# Patient Record
Sex: Male | Born: 1975
Health system: Southern US, Community
[De-identification: ages and names within clinical notes are randomized; demographics above are authoritative.]

---

## 1999-04-09 ENCOUNTER — Emergency Department (HOSPITAL_COMMUNITY): Admission: EM | Admit: 1999-04-09 | Discharge: 1999-04-09 | Payer: Self-pay

## 1999-04-29 ENCOUNTER — Emergency Department (HOSPITAL_COMMUNITY): Admission: EM | Admit: 1999-04-29 | Discharge: 1999-04-29 | Payer: Self-pay | Admitting: *Deleted

## 1999-07-11 ENCOUNTER — Emergency Department (HOSPITAL_COMMUNITY): Admission: EM | Admit: 1999-07-11 | Discharge: 1999-07-11 | Payer: Self-pay | Admitting: Emergency Medicine

## 1999-07-12 ENCOUNTER — Emergency Department (HOSPITAL_COMMUNITY): Admission: EM | Admit: 1999-07-12 | Discharge: 1999-07-12 | Payer: Self-pay | Admitting: Emergency Medicine

## 2002-02-08 ENCOUNTER — Encounter: Payer: Self-pay | Admitting: Family Medicine

## 2002-02-08 ENCOUNTER — Encounter: Admission: RE | Admit: 2002-02-08 | Discharge: 2002-02-08 | Payer: Self-pay | Admitting: Family Medicine

## 2002-10-22 ENCOUNTER — Encounter: Payer: Self-pay | Admitting: Family Medicine

## 2002-10-22 ENCOUNTER — Encounter: Admission: RE | Admit: 2002-10-22 | Discharge: 2002-10-22 | Payer: Self-pay | Admitting: Family Medicine

## 2003-02-02 HISTORY — PX: ACHILLES TENDON SURGERY: SHX542

## 2003-05-24 ENCOUNTER — Emergency Department (HOSPITAL_COMMUNITY): Admission: EM | Admit: 2003-05-24 | Discharge: 2003-05-24 | Payer: Self-pay | Admitting: Emergency Medicine

## 2010-04-28 ENCOUNTER — Telehealth: Payer: Self-pay | Admitting: *Deleted

## 2010-04-28 NOTE — Telephone Encounter (Signed)
Has new pt appt scheduled on 06/02/10, would like to know if he could be worked in sooner.  Not having any major concerns but would like to get bp checked.

## 2010-04-28 NOTE — Telephone Encounter (Signed)
Have him purchase.  A pump up digital blood pressure cuff and measure his blood pressure daily at home in the morning............ Normal 135/85. ,,,,,,,,,,,,,,,,,,,,, if after two weeks of data gathering his blood pressure is normal and just checking weekly.  If after two, weeks it's elevated it has come to see [place] sooner

## 2010-04-29 NOTE — Telephone Encounter (Signed)
Left message on machine for patient

## 2010-06-02 ENCOUNTER — Ambulatory Visit (INDEPENDENT_AMBULATORY_CARE_PROVIDER_SITE_OTHER): Payer: 59 | Admitting: Family Medicine

## 2010-06-02 ENCOUNTER — Encounter: Payer: Self-pay | Admitting: Family Medicine

## 2010-06-02 VITALS — BP 134/84 | Temp 98.8°F | Ht 67.0 in | Wt 205.0 lb

## 2010-06-02 DIAGNOSIS — R030 Elevated blood-pressure reading, without diagnosis of hypertension: Secondary | ICD-10-CM

## 2010-06-02 NOTE — Progress Notes (Signed)
  Subjective:    Patient ID: Garrett Anderson, male    DOB: 03/07/1975, 35 y.o.   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  He status, though the past couple months.  His blood pressure would jump up.  He typically runs 135/85 or less.  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  His mother had hypertension.  His always been in excellent health.  He had a left Achilles' tendon repair, and L5.  A colonoscopy in 09, which is normal.  Because of a family history of colon cancer.  Married 3 kids.  Vaccinations up to date.      Review of Systems    General and cardiovascular review of systems otherwise negative Objective:   Physical Exam Well-developed well-nourished, male in no acute distress.  BP right arm sitting position 150/70       Assessment & Plan:  Question hypertension,,,,,,,,,,,, BP check.  Q.a.m. X 3 weeks.  Return in 3 weeks with the data

## 2010-06-02 NOTE — Patient Instructions (Signed)
Check your blood pressure daily in the morning.  Return in 3 weeks with the data and the device

## 2010-06-30 ENCOUNTER — Encounter: Payer: Self-pay | Admitting: Family Medicine

## 2010-06-30 ENCOUNTER — Ambulatory Visit (INDEPENDENT_AMBULATORY_CARE_PROVIDER_SITE_OTHER): Payer: 59 | Admitting: Family Medicine

## 2010-06-30 VITALS — BP 148/90 | Temp 98.6°F | Wt 204.0 lb

## 2010-06-30 DIAGNOSIS — I10 Essential (primary) hypertension: Secondary | ICD-10-CM

## 2010-06-30 MED ORDER — LISINOPRIL-HYDROCHLOROTHIAZIDE 10-12.5 MG PO TABS: ORAL_TABLET | ORAL | Status: DC

## 2010-06-30 NOTE — Progress Notes (Signed)
  Subjective:    Patient ID: Garrett Anderson, male    DOB: 26-Feb-1975, 35 y.o.   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  He said elevated blood pressures at home and at work.  We saw him a month ago.  We put him on a salt free diet and is to monitoring his blood pressure at home.  He is pretty much normal in the morning 130/82, but during the day.  He climbs up into the 140 to 150 systolic, 85 to 90 diastolic.  Positive family history of hypertension    Review of Systems    General and cardiovascular review of systems otherwise negative Objective:   Physical Exam    Well-developed well-nourished, male in no acute distress.  BP right arm sitting, 140/90    Assessment & Plan:  ,Hypertension,,,,,,,,,,,, start Zestoretic one half tab daily BP check 3 times daily.  Follow-up in one month

## 2010-06-30 NOTE — Patient Instructions (Signed)
Zestoretic one half tab q.a.m.  Hives and cough.  The two potential side effects,,,,,,,,,,,, if he developed hives stopped the medicine immediately and call us.  Check your blood pressure morning midafternoon and bedtime.  Follow-up in 4 weeks

## 2010-07-28 ENCOUNTER — Ambulatory Visit: Payer: 59 | Admitting: Family Medicine

## 2010-08-03 ENCOUNTER — Ambulatory Visit (INDEPENDENT_AMBULATORY_CARE_PROVIDER_SITE_OTHER): Payer: 59 | Admitting: Family Medicine

## 2010-08-03 ENCOUNTER — Encounter: Payer: Self-pay | Admitting: Family Medicine

## 2010-08-03 VITALS — BP 110/80 | Temp 98.4°F | Wt 204.0 lb

## 2010-08-03 DIAGNOSIS — I10 Essential (primary) hypertension: Secondary | ICD-10-CM

## 2010-08-03 NOTE — Progress Notes (Signed)
  Subjective:    Patient ID: Garrett Anderson, male    DOB: 03-20-1975, 35 y.o.   MRN: [id number]  HPIhassan Is a 35 year old male, married, who comes in today,,,,, nonsmoker,,,,,, for follow-up of hypertension.  We started him on Zestoretic 10 to 12.5 dose one half tab q.a.m., a month ago.  Blood pressure now is 110/80.  No side effects to medication    Review of Systems    General and cardiovascular use systems negative Objective:   Physical Exam    Well-developed well-nourished, male, no acute distress.  BP 110/80    Assessment & Plan:  Hypertension under good control with Zestoretic 10 to 12.5 dose one half tab q.a.m. Continue medication.  Daily follow-up CPX

## 2010-08-03 NOTE — Patient Instructions (Signed)
Continued your current medications one half tab daily.  Check your blood pressure weekly in the morning.  If you get an elevated reading and check it daily for 3 weeks and if the blood pressure is consistently elevated.  After 3 weeks, then return to see me for follow-up.  Set up a time in the next month or two for a physical exam,,,,,,,,,,,,, fasting labs one week prior

## 2010-10-12 ENCOUNTER — Ambulatory Visit (INDEPENDENT_AMBULATORY_CARE_PROVIDER_SITE_OTHER): Payer: 59 | Admitting: Family Medicine

## 2010-10-12 ENCOUNTER — Encounter: Payer: Self-pay | Admitting: Family Medicine

## 2010-10-12 VITALS — BP 158/98 | Temp 98.5°F | Ht 67.25 in | Wt 204.0 lb

## 2010-10-12 DIAGNOSIS — I10 Essential (primary) hypertension: Secondary | ICD-10-CM

## 2010-10-12 LAB — POCT URINALYSIS DIPSTICK
Bilirubin, UA: NEGATIVE
Leukocytes, UA: NEGATIVE
Nitrite, UA: NEGATIVE
Urobilinogen, UA: 0.2
pH, UA: 7

## 2010-10-12 LAB — LIPID PANEL
HDL: 39.9 mg/dL (ref >39.00)
LDL Cholesterol: 130 mg/dL — ABNORMAL HIGH (ref 0–99)
Total CHOL/HDL Ratio: 4

## 2010-10-12 LAB — HEPATIC FUNCTION PANEL
ALT: 24 U/L (ref 0–53)
Alkaline Phosphatase: 54 U/L (ref 39–117)
Bilirubin, Direct: 0 mg/dL (ref 0.0–0.3)
Total Bilirubin: 0.5 mg/dL (ref 0.3–1.2)
Total Protein: 7.1 g/dL (ref 6.0–8.3)

## 2010-10-12 LAB — CBC WITH DIFFERENTIAL/PLATELET
Basophils Relative: 0.5 % (ref 0.0–3.0)
Eosinophils Absolute: 0.1 10*3/uL (ref 0.0–0.7)
Eosinophils Relative: 2.9 % (ref 0.0–5.0)
Lymphocytes Relative: 40.5 % (ref 12.0–46.0)
Neutrophils Relative %: 43.4 % (ref 43.0–77.0)
RBC: 4.86 Mil/uL (ref 4.22–5.81)
WBC: 3.4 10*3/uL — ABNORMAL LOW (ref 4.5–10.5)

## 2010-10-12 LAB — BASIC METABOLIC PANEL
Calcium: 8.9 mg/dL (ref 8.4–10.5)
Creatinine, Ser: 1.1 mg/dL (ref 0.4–1.5)

## 2010-10-12 MED ORDER — LISINOPRIL-HYDROCHLOROTHIAZIDE 10-12.5 MG PO TABS: ORAL_TABLET | ORAL | Status: DC

## 2010-10-12 NOTE — Progress Notes (Signed)
  Subjective:    Patient ID: Garrett Anderson, male    DOB: 1975/03/21, 35 y.o.   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  He was on Zestoretic 10 to 12.5, and his blood pressure was normal.  However, he stopped it about 3 weeks ago for two weeks, then restarted week ago.  Advise in the future not to stop his daily blood pressure medication.  BP today 150/98.  He will take his medication daily and faxed the data in 3 weeks.  If blood pressures not to goal.  Review of systems negative.  Vaccination history tetanus 2008 he gets the flu shot at work    Review of Systems    Total review of systems otherwise negative Objective:   Physical Exam  Well-developed muscular male, in no acute distress.  Examination of the head, eyes, ears, nose, and throat were negative.  Neck was supple.  No adenopathy.  Thyroid normal.  Lungs are clear to auscultation.  Cardiac exam normal.  Abdominal exam normal.  Genitalia normal circumcised male.  Extremities normal.  Skin normal.  Peripheral pulses normal     Assessment & Plan:  Healthy male.  Hypertension take the Zestoretic 10 -- 12.5 dose one half tab q.a.m. If in 3 weeks.  Blood pressure is not at goal.  Call

## 2010-10-12 NOTE — Patient Instructions (Signed)
Continued the Zestoretic............. One half tab / day of your blood pressure medication.  Check your blood pressure daily.............. if in 3 weeks, your blood pressure is 135/85 or less than continue a half a tablet daily.  If however, your blood pressure is not at goal.  Call, so we can readjust y  medication

## 2011-05-07 ENCOUNTER — Ambulatory Visit: Payer: 59 | Admitting: Internal Medicine

## 2011-11-03 ENCOUNTER — Other Ambulatory Visit: Payer: Self-pay | Admitting: Family Medicine

## 2012-03-01 ENCOUNTER — Ambulatory Visit: Payer: 59 | Admitting: Family Medicine

## 2012-03-02 ENCOUNTER — Ambulatory Visit: Payer: 59 | Admitting: Family Medicine

## 2012-04-10 ENCOUNTER — Other Ambulatory Visit: Payer: Self-pay | Admitting: *Deleted

## 2012-04-10 MED ORDER — LISINOPRIL-HYDROCHLOROTHIAZIDE 10-12.5 MG PO TABS: ORAL_TABLET | ORAL | Status: DC

## 2012-05-12 ENCOUNTER — Encounter: Payer: Self-pay | Admitting: Family Medicine

## 2012-05-12 ENCOUNTER — Ambulatory Visit (INDEPENDENT_AMBULATORY_CARE_PROVIDER_SITE_OTHER): Payer: 59 | Admitting: Family Medicine

## 2012-05-12 VITALS — BP 140/90 | Temp 98.6°F | Wt 196.0 lb

## 2012-05-12 DIAGNOSIS — I10 Essential (primary) hypertension: Secondary | ICD-10-CM

## 2012-05-12 DIAGNOSIS — R079 Chest pain, unspecified: Secondary | ICD-10-CM | POA: Insufficient documentation

## 2012-05-12 NOTE — Patient Instructions (Signed)
Motrin 400 mg,,,,,,,,,,,,,,, twice daily with food as needed for the chest wall pain  Continue blood pressure medication daily  Monitor your blood pressure weekly as outlined  Return when necessary
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
## 2012-06-16 ENCOUNTER — Other Ambulatory Visit: Payer: Self-pay | Admitting: Family Medicine

## 2012-07-19 ENCOUNTER — Ambulatory Visit (INDEPENDENT_AMBULATORY_CARE_PROVIDER_SITE_OTHER): Payer: 59 | Admitting: Family

## 2012-07-19 ENCOUNTER — Other Ambulatory Visit: Payer: Self-pay | Admitting: *Deleted

## 2012-07-19 ENCOUNTER — Encounter: Payer: Self-pay | Admitting: Family

## 2012-07-19 VITALS — BP 130/76 | HR 75 | Temp 98.6°F | Wt 200.0 lb

## 2012-07-19 DIAGNOSIS — H9202 Otalgia, left ear: Secondary | ICD-10-CM

## 2012-07-19 DIAGNOSIS — H6692 Otitis media, unspecified, left ear: Secondary | ICD-10-CM

## 2012-07-19 DIAGNOSIS — H6690 Otitis media, unspecified, unspecified ear: Secondary | ICD-10-CM

## 2012-07-19 DIAGNOSIS — H9209 Otalgia, unspecified ear: Secondary | ICD-10-CM

## 2012-07-19 MED ORDER — LISINOPRIL-HYDROCHLOROTHIAZIDE 10-12.5 MG PO TABS: ORAL_TABLET | ORAL | Status: DC

## 2012-07-19 MED ORDER — AMOXICILLIN 500 MG PO TABS: 1000.0000 mg | ORAL_TABLET | Freq: Two times a day (BID) | ORAL | Status: DC

## 2012-07-19 NOTE — Progress Notes (Signed)
  Subjective:    Patient ID: Garrett Anderson, male    DOB: 1975/07/31, 37 y.o.   MRN: [id number]  HPI 37 year old [place] American male, nonsmoker, patient of Dr. Tawanna Cooler is in today with complaints of feeling like his ears are stopped up. The left is worse in the right. His morning, noticed pain in his left ear. Has a history of otitis media in the past as well as otitis externa.   Review of Systems  Constitutional: Negative.   HENT: Positive for ear pain. Negative for congestion, rhinorrhea, postnasal drip and ear discharge.   Eyes: Negative.   Respiratory: Negative.   Cardiovascular: Negative.   Genitourinary: Negative.   Musculoskeletal: Negative.   Skin: Negative.   Allergic/Immunologic: Negative.   Neurological: Negative.   Psychiatric/Behavioral: Negative.    No past medical history on file.  History   Social History  . Marital Status: Married    Spouse Name: N/A    Number of Children: N/A  . Years of Education: N/A   Occupational History  . Not on file.   Social History Main Topics  . Smoking status: Never Smoker   . Smokeless tobacco: Not on file  . Alcohol Use: Yes  . Drug Use: No  . Sexually Active:    Other Topics Concern  . 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 Achilles tendon surgery  02/02/03    left    Family History  Problem Relation Age of Onset  . Cancer Mother     lung  . Cancer Paternal Grandfather     No Known Allergies  Current Outpatient Prescriptions on File Prior to Visit  Medication Sig Dispense Refill  . lisinopril-hydrochlorothiazide (PRINZIDE,ZESTORETIC) 10-12.5 MG per tablet TAKE ONE-HALF TABLET BY MOUTH ONCE DAILY AS NEEDED-NEEDS TO BE SEEN  30 tablet  0   No current facility-administered medications on file prior to visit.    BP 130/76  Pulse 75  Temp(Src) 98.6 F (37 C) (Oral)  Wt 200 lb (90.719 kg)  BMI 31.1 kg/m2  SpO2  97%chart    Objective:   Physical Exam  Constitutional: He is oriented to person, place, and time. He appears well-developed and well-nourished.  HENT:  Right Ear: External ear normal.  Nose: Nose normal.  Mouth/Throat: Oropharynx is clear and moist.  Left ear has a bulging tympanic membrane is oriented. No tragal tenderness. Canal is normal and intact.  Neck: Normal range of motion.  Cardiovascular: Normal rate, regular rhythm and normal heart sounds.   Pulmonary/Chest: Effort normal and breath sounds normal.  Neurological: He is alert and oriented to person, place, and time.  Skin: Skin is warm and dry.  Psychiatric: He has a normal mood and affect.          Assessment & Plan:  Assessment:  1. Otitis media 2. Otalgia  Plan: Amoxicillin 500 mg 2 capsules by mouth twice a day x10 days. Tylenol or ibuprofen as needed for pain. Patient call the office is symptoms worsen or persist. Recheck as scheduled, and as needed.

## 2012-07-19 NOTE — Patient Instructions (Signed)

## 2012-08-23 ENCOUNTER — Ambulatory Visit (INDEPENDENT_AMBULATORY_CARE_PROVIDER_SITE_OTHER): Payer: 59 | Admitting: Family

## 2012-08-23 ENCOUNTER — Encounter: Payer: Self-pay | Admitting: Family

## 2012-08-23 VITALS — BP 134/90 | HR 72 | Wt 201.0 lb

## 2012-08-23 DIAGNOSIS — I10 Essential (primary) hypertension: Secondary | ICD-10-CM

## 2012-08-23 DIAGNOSIS — H60399 Other infective otitis externa, unspecified ear: Secondary | ICD-10-CM

## 2012-08-23 DIAGNOSIS — H9209 Otalgia, unspecified ear: Secondary | ICD-10-CM

## 2012-08-23 DIAGNOSIS — H9201 Otalgia, right ear: Secondary | ICD-10-CM

## 2012-08-23 DIAGNOSIS — H60391 Other infective otitis externa, right ear: Secondary | ICD-10-CM

## 2012-08-23 MED ORDER — NEOMYCIN-POLYMYXIN-HC 3.5-10000-1 OT SOLN: 3.0000 [drp] | Freq: Four times a day (QID) | OTIC | Status: DC

## 2012-08-23 NOTE — Patient Instructions (Signed)
Otitis Externa Otitis externa is a bacterial or fungal infection of the outer ear canal. This is the area from the eardrum to the outside of the ear. Otitis externa is sometimes called "swimmer's ear." CAUSES  Possible causes of infection include:  Swimming in dirty water.  Moisture remaining in the ear after swimming or bathing.  Mild injury (trauma) to the ear.  Objects stuck in the ear (foreign body).  Cuts or scrapes (abrasions) on the outside of the ear. SYMPTOMS  The first symptom of infection is often itching in the ear canal. Later signs and symptoms may include swelling and redness of the ear canal, ear pain, and yellowish-white fluid (pus) coming from the ear. The ear pain may be worse when pulling on the earlobe. DIAGNOSIS  Your caregiver will perform a physical exam. A sample of fluid may be taken from the ear and examined for bacteria or fungi. TREATMENT  Antibiotic ear drops are often given for 10 to 14 days. Treatment may also include pain medicine or corticosteroids to reduce itching and swelling. PREVENTION   Keep your ear dry. Use the corner of a towel to absorb water out of the ear canal after swimming or bathing.  Avoid scratching or putting objects inside your ear. This can damage the ear canal or remove the protective wax that lines the canal. This makes it easier for bacteria and fungi to grow.  Avoid swimming in lakes, polluted water, or poorly chlorinated pools.  You may use ear drops made of rubbing alcohol and vinegar after swimming. Combine equal parts of white vinegar and alcohol in a bottle. Put 3 or 4 drops into each ear after swimming. HOME CARE INSTRUCTIONS   Apply antibiotic ear drops to the ear canal as prescribed by your caregiver.  Only take over-the-counter or prescription medicines for pain, discomfort, or fever as directed by your caregiver.  If you have diabetes, follow any additional treatment instructions from your caregiver.  Keep all  follow-up appointments as directed by your caregiver. SEEK MEDICAL CARE IF:   You have a fever.  Your ear is still red, swollen, painful, or draining pus after 3 days.  Your redness, swelling, or pain gets worse.  You have a severe headache.  You have redness, swelling, pain, or tenderness in the area behind your ear. MAKE SURE YOU:   Understand these instructions.  Will watch your condition.  Will get help right away if you are not doing well or get worse. Document Released: 01/18/2005 Document Revised: 04/12/2011 Document Reviewed: 02/04/2011 [employer] Patient Information 2014 [employer].  

## 2012-08-23 NOTE — Progress Notes (Signed)
  Subjective:    Patient ID: Garrett Anderson, male    DOB: 1975/10/30, 37 y.o.   MRN: [id number]  HPI 37 year old American male, nonsmoker, patient of Dr. Tawanna Cooler he is in today with right ear pain x1 week. He has been taking ibuprofen helps. He was seen in June for a left otitis media. Reports he has been swimming. But has not routinely. Dyspnea, cough, or congestion.   Review of Systems  Constitutional: Negative.   HENT: Positive for ear pain and ear discharge.   Respiratory: Negative.   Cardiovascular: Negative.   Gastrointestinal: Negative.   Genitourinary: Negative.   Musculoskeletal: Negative.   Skin: Negative.   Allergic/Immunologic: Negative.   Neurological: Negative.   Hematological: Negative.   Psychiatric/Behavioral: Negative.    History reviewed. No pertinent past medical history.  History   Social History  . Marital Status: Married    Spouse Name: N/A    Number of Children: N/A  . Years of Education: N/A   Occupational History  . Not on file.   Social History Main Topics  . Smoking status: Never Smoker   . Smokeless tobacco: Not on file  . Alcohol Use: Yes  . Drug Use: No  . Sexually Active:    Other Topics Concern  . 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 Achilles tendon surgery  02/02/03    left    Family History  Problem Relation Age of Onset  . Cancer Mother     lung  . Cancer Paternal Grandfather     No Known Allergies  Current Outpatient Prescriptions on File Prior to Visit  Medication Sig Dispense Refill  . aspirin 81 MG tablet Take 81 mg by mouth daily.      Marland Kitchen lisinopril-hydrochlorothiazide (PRINZIDE,ZESTORETIC) 10-12.5 MG per tablet TAKE ONE-HALF TABLET BY MOUTH ONCE DAILY AS NEEDED  90 tablet  2  . amoxicillin (AMOXIL) 500 MG tablet Take 2 tablets (1,000 mg total) by mouth 2 (two) times daily.  40 tablet  0   No current  facility-administered medications on file prior to visit.    BP 134/90  Pulse 72  Wt 201 lb (91.173 kg)  BMI 31.25 kg/m2  SpO2 98%chart    Objective:   Physical Exam  Constitutional: He is oriented to person, place, and time. He appears well-developed and well-nourished.  HENT:  Left Ear: External ear normal.  Nose: Nose normal.  Mouth/Throat: Oropharynx is clear and moist.  Tragal tenderness to palpation of the right ear. Canal swelling and tenderness. Small amount of pustular discharge.   Neck: Normal range of motion. Neck supple.  Cardiovascular: Normal rate, regular rhythm and normal heart sounds.   Pulmonary/Chest: Effort normal and breath sounds normal.  Musculoskeletal: Normal range of motion.  Neurological: He is alert and oriented to person, place, and time.  Skin: Skin is warm and dry.  Psychiatric: He has a normal mood and affect.          Assessment & Plan:  Assessment:  1. otalgia-right 2. Otitis externa  Plan: Cortisporin otic 3 down from the right ear 4 times a day. Call the office if any questions or concerns. Check as scheduled, and as needed.

## 2012-09-07 ENCOUNTER — Other Ambulatory Visit: Payer: Self-pay | Admitting: *Deleted

## 2012-09-07 MED ORDER — LISINOPRIL-HYDROCHLOROTHIAZIDE 10-12.5 MG PO TABS: ORAL_TABLET | ORAL | Status: DC

## 2012-12-14 ENCOUNTER — Telehealth: Payer: Self-pay | Admitting: Family Medicine

## 2012-12-14 ENCOUNTER — Encounter: Payer: Self-pay | Admitting: Family Medicine

## 2012-12-14 ENCOUNTER — Ambulatory Visit (INDEPENDENT_AMBULATORY_CARE_PROVIDER_SITE_OTHER): Payer: 59 | Admitting: Family Medicine

## 2012-12-14 VITALS — BP 160/80 | Temp 98.3°F | Wt 206.0 lb

## 2012-12-14 DIAGNOSIS — R109 Unspecified abdominal pain: Secondary | ICD-10-CM

## 2012-12-14 DIAGNOSIS — R351 Nocturia: Secondary | ICD-10-CM

## 2012-12-14 DIAGNOSIS — R1031 Right lower quadrant pain: Secondary | ICD-10-CM

## 2012-12-14 LAB — POCT URINALYSIS DIPSTICK
Bilirubin, UA: NEGATIVE
Glucose, UA: NEGATIVE
Leukocytes, UA: NEGATIVE
Nitrite, UA: NEGATIVE
Urobilinogen, UA: 0.2
pH, UA: 8

## 2012-12-14 NOTE — Telephone Encounter (Signed)
Pt will be here at 4:30 today!

## 2012-12-14 NOTE — Progress Notes (Signed)
  Subjective:    Patient ID: Garrett Anderson, male    DOB: 09/07/1975, 37 y.o.   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 He states that last night he was in the shower and he felt a lump in his right scrotum. He's had no fever chills nausea vomiting diarrhea or urinary tract symptoms.  He states years ago he had a workup and was told he was not producing sperm   Review of Systems    review of systems otherwise negative Objective:   Physical Exam  Well-developed well-nourished male no acute distress examination genitalia testicle normal penis normal uncircumcised no masses no tenderness urinalysis normal      Assessment & Plan:  Groin pain an etiology reassured return when necessary

## 2012-12-14 NOTE — Telephone Encounter (Signed)
Please call patient and see if he would like to come in at 4:30

## 2012-12-14 NOTE — Patient Instructions (Signed)
Your physical examination in your in today were normal  If you have further symptoms return to see [place] or see your urologist for evaluation

## 2012-12-14 NOTE — Progress Notes (Signed)
Pre visit review using our clinic review tool, if applicable. No additional management support is needed unless otherwise documented below in the visit note. 

## 2012-12-14 NOTE — Telephone Encounter (Signed)
Pt is having a lot of dull pain in groin area. Pt would like to be seen asap.

## 2013-08-27 ENCOUNTER — Encounter: Payer: Self-pay | Admitting: Physician Assistant

## 2013-08-27 ENCOUNTER — Ambulatory Visit (INDEPENDENT_AMBULATORY_CARE_PROVIDER_SITE_OTHER): Payer: 59 | Admitting: Physician Assistant

## 2013-08-27 VITALS — BP 124/68 | HR 72 | Temp 98.9°F | Resp 18 | Wt 205.0 lb

## 2013-08-27 DIAGNOSIS — J029 Acute pharyngitis, unspecified: Secondary | ICD-10-CM

## 2013-08-27 LAB — POCT RAPID STREP A (OFFICE): RAPID STREP A SCREEN: NEGATIVE

## 2013-08-27 MED ORDER — AMOXICILLIN 500 MG PO CAPS: 500.0000 mg | ORAL_CAPSULE | Freq: Two times a day (BID) | ORAL | Status: DC

## 2013-08-27 NOTE — Patient Instructions (Addendum)
Amoxicillin twice daily for 10 days.  Continue using salt water gargles and sore throat lozenges.  If emergency symptoms discussed during visit developed, seek medical attention immediately.  Followup as needed, or for worsening or persistent symptoms despite treatment.   Sore Throat A sore throat is a painful, burning, sore, or scratchy feeling of the throat. There may be pain or tenderness when swallowing or talking. You may have other symptoms with a sore throat. These include coughing, sneezing, fever, or a swollen neck. A sore throat is often the first sign of another sickness. These sicknesses may include a cold, flu, strep throat, or an infection called mono. Most sore throats go away without medical treatment.  HOME CARE   Only take medicine as told by your doctor.  Drink enough fluids to keep your pee (urine) clear or pale yellow.  Rest as needed.  Try using throat sprays, lozenges, or suck on hard candy (if older than 4 years or as told).  Sip warm liquids, such as broth, herbal tea, or warm water with honey. Try sucking on frozen ice pops or drinking cold liquids.  Rinse the mouth (gargle) with salt water. Mix 1 teaspoon salt with 8 ounces of water.  Do not smoke. Avoid being around others when they are smoking.  Put a humidifier in your bedroom at night to moisten the air. You can also turn on a hot shower and sit in the bathroom for 5-10 minutes. Be sure the bathroom door is closed. GET HELP RIGHT AWAY IF:   You have trouble breathing.  You cannot swallow fluids, soft foods, or your spit (saliva).  You have more puffiness (swelling) in the throat.  Your sore throat does not get better in 7 days.  You feel sick to your stomach (nauseous) and throw up (vomit).  You have a fever or lasting symptoms for more than 2-3 days.  You have a fever and your symptoms suddenly get worse. MAKE SURE YOU:   Understand these instructions.  Will watch your condition.  Will  get help right away if you are not doing well or get worse. Document Released: 10/28/2007 Document Revised: 10/13/2011 Document Reviewed: 09/26/2011 [facility] CenterExitCare Patient Information 2015 [employer], [place]. This information is not intended to replace advice given to you by your health care provider. Make sure you discuss any questions you have with your health care provider.

## 2013-08-27 NOTE — Progress Notes (Signed)
Pre visit review using our clinic review tool, if applicable. No additional management support is needed unless otherwise documented below in the visit note. 

## 2013-08-27 NOTE — Progress Notes (Signed)
Subjective:    Patient ID: Garrett Anderson, male    DOB: 1975-12-22, 38 y.o.   MRN: [id number]  Sore Throat  This is a new problem. The current episode started in the past 7 days. The problem has been unchanged. Neither side of throat is experiencing more pain than the other. There has been no fever. The pain is at a severity of 5/10. The pain is moderate. Associated symptoms include congestion (mild), coughing (resolving.), a hoarse voice, swollen glands and trouble swallowing (mild). Pertinent negatives include no abdominal pain, diarrhea, drooling, ear discharge, ear pain, headaches, plugged ear sensation, neck pain, shortness of breath, stridor or vomiting. He has had no exposure to strep or mono. Treatments tried: theraflu. The treatment provided mild relief.      Review of Systems  Constitutional: Positive for fever (no temp at home). Negative for chills.  HENT: Positive for congestion (mild), hoarse voice, postnasal drip, sore throat and trouble swallowing (mild). Negative for drooling, ear discharge, ear pain and sinus pressure.   Respiratory: Positive for cough (resolving.). Negative for shortness of breath and stridor.   Cardiovascular: Negative for chest pain.  Gastrointestinal: Negative for nausea, vomiting, abdominal pain and diarrhea.  Musculoskeletal: Negative for neck pain.  Neurological: Negative for syncope and headaches.  All other systems reviewed and are negative.     History reviewed. No pertinent past medical history.  History   Social History  . Marital Status: Married    Spouse Name: N/A    Number of Children: N/A  . Years of Education: N/A   Occupational History  . Not on file.   Social History Main Topics  . Smoking status: Never Smoker   . Smokeless tobacco: Not on file  . Alcohol Use: Yes  . Drug Use: No  . Sexual Activity:    Other Topics Concern  . Not on file   Social History Narrative   Married   Theatre stage manager - [place]    Past  Surgical History  Procedure Laterality Date  . Achilles tendon surgery  02/02/03    left    Family History  Problem Relation Age of Onset  . Cancer Mother     lung  . Cancer Paternal Grandfather     No Known Allergies  Current Outpatient Prescriptions on File Prior to Visit  Medication Sig Dispense Refill  . aspirin 81 MG tablet Take 81 mg by mouth daily.      Marland Kitchen lisinopril-hydrochlorothiazide (PRINZIDE,ZESTORETIC) 10-12.5 MG per tablet TAKE ONE-HALF TABLET BY MOUTH ONCE DAILY AS NEEDED  90 tablet  3   No current facility-administered medications on file prior to visit.    EXAM: BP 124/68  Pulse 72  Temp(Src) 98.9 F (37.2 C) (Oral)  Resp 18  Wt 205 lb (92.987 kg)     Objective:   Physical Exam  Nursing note and vitals reviewed. Constitutional: He is oriented to person, place, and time. He appears well-developed and well-nourished. No distress.  HENT:  Head: Normocephalic and atraumatic.  Right Ear: External ear normal.  Left Ear: External ear normal.  Nose: Nose normal.  Mouth/Throat: Oropharyngeal exudate present.  Bilateral TMs normal. Bilateral frontal and maxillary sinuses non-TTP.  Eyes: Conjunctivae and EOM are normal. Pupils are equal, round, and reactive to light.  Neck: Normal range of motion. Neck supple.  Cardiovascular: Normal rate, regular rhythm and intact distal pulses.   Pulmonary/Chest: Effort normal and breath sounds normal. No stridor. No respiratory distress. He has no wheezes. He has  no rales. He exhibits no tenderness.  Lymphadenopathy:    He has cervical adenopathy.  Neurological: He is alert and oriented to person, place, and time.  Skin: Skin is warm and dry. No rash noted. He is not diaphoretic. No erythema. No pallor.  Psychiatric: He has a normal mood and affect. His behavior is normal. Judgment and thought content normal.     Lab Results  Component Value Date   WBC 3.4* 10/12/2010   HGB 14.3 10/12/2010   HCT 42.8 10/12/2010   PLT  195.0 10/12/2010   GLUCOSE 86 10/12/2010   CHOL 178 10/12/2010   TRIG 39.0 10/12/2010   HDL 39.90 10/12/2010   LDLCALC 130* 10/12/2010   ALT 24 10/12/2010   AST 21 10/12/2010   NA 138 10/12/2010   K 4.1 10/12/2010   CL 104 10/12/2010   CREATININE 1.1 10/12/2010   BUN 15 10/12/2010   CO2 27 10/12/2010   TSH 0.30* 10/12/2010        Assessment & Plan:  Roseanne RenoHassan was seen today for sore throat.  Diagnoses and associated orders for this visit:  Sore throat Comments: Negative Strep, Exudate on exam, will treat with Amoxicillin.  - POCT rapid strep A - amoxicillin (AMOXIL) 500 MG capsule; Take 1 capsule (500 mg total) by mouth 2 (two) times daily.    Rapid strep negative, however exudate on exam, suspicious of false negative, will treat. Some non-classical symptoms, however they are mild. Predominate symptoms are classical.  Return precautions provided, and patient handout on sore throat.  Plan to follow up as needed, or for worsening or persistent symptoms despite treatment.  Patient Instructions  Amoxicillin twice daily for 10 days.  Continue using salt water gargles and sore throat lozenges.  If emergency symptoms discussed during visit developed, seek medical attention immediately.  Followup as needed, or for worsening or persistent symptoms despite treatment.

## 2014-01-10 ENCOUNTER — Other Ambulatory Visit: Payer: Self-pay | Admitting: Family Medicine

## 2014-04-24 ENCOUNTER — Encounter: Payer: Self-pay | Admitting: Family Medicine

## 2014-04-24 ENCOUNTER — Ambulatory Visit (INDEPENDENT_AMBULATORY_CARE_PROVIDER_SITE_OTHER): Payer: 59 | Admitting: Family Medicine

## 2014-04-24 VITALS — BP 128/80 | HR 78 | Temp 98.2°F | Wt 210.0 lb

## 2014-04-24 DIAGNOSIS — R55 Syncope and collapse: Secondary | ICD-10-CM

## 2014-04-24 DIAGNOSIS — R404 Transient alteration of awareness: Secondary | ICD-10-CM | POA: Diagnosis not present

## 2014-04-24 LAB — CBC WITH DIFFERENTIAL/PLATELET
Basophils Absolute: 0 10*3/uL (ref 0.0–0.1)
Basophils Relative: 0.6 % (ref 0.0–3.0)
Eosinophils Absolute: 0.2 10*3/uL (ref 0.0–0.7)
Eosinophils Relative: 5.2 % — ABNORMAL HIGH (ref 0.0–5.0)
HCT: 43.3 % (ref 39.0–52.0)
Hemoglobin: 14.6 g/dL (ref 13.0–17.0)
Lymphocytes Relative: 38.4 % (ref 12.0–46.0)
Lymphs Abs: 1.5 10*3/uL (ref 0.7–4.0)
MCHC: 33.8 g/dL (ref 30.0–36.0)
MCV: 86.1 fl (ref 78.0–100.0)
MONOS PCT: 12 % (ref 3.0–12.0)
Monocytes Absolute: 0.5 10*3/uL (ref 0.1–1.0)
NEUTROS PCT: 43.8 % (ref 43.0–77.0)
Neutro Abs: 1.8 10*3/uL (ref 1.4–7.7)
Platelets: 203 10*3/uL (ref 150.0–400.0)
RBC: 5.03 Mil/uL (ref 4.22–5.81)
RDW: 13.3 % (ref 11.5–15.5)
WBC: 4 10*3/uL (ref 4.0–10.5)

## 2014-04-24 LAB — BASIC METABOLIC PANEL WITH GFR
BUN: 15 mg/dL (ref 6–23)
CO2: 32 meq/L (ref 19–32)
Calcium: 9.3 mg/dL (ref 8.4–10.5)
Chloride: 103 meq/L (ref 96–112)
Creatinine, Ser: 1.24 mg/dL (ref 0.40–1.50)
GFR: 83.63 mL/min
Glucose, Bld: 86 mg/dL (ref 70–99)
Potassium: 4.6 meq/L (ref 3.5–5.1)
Sodium: 138 meq/L (ref 135–145)

## 2014-04-24 LAB — HEPATIC FUNCTION PANEL
ALBUMIN: 4.1 g/dL (ref 3.5–5.2)
ALK PHOS: 62 U/L (ref 39–117)
ALT: 23 U/L (ref 0–53)
AST: 23 U/L (ref 0–37)
BILIRUBIN DIRECT: 0.1 mg/dL (ref 0.0–0.3)
Total Bilirubin: 0.5 mg/dL (ref 0.2–1.2)
Total Protein: 7.1 g/dL (ref 6.0–8.3)

## 2014-04-24 NOTE — Progress Notes (Signed)
Subjective:    Patient ID: Garrett Anderson, male    DOB: January 23, 1976, 39 y.o.   MRN: [id number]  HPI Patient seen following possible brief alteration of consciousness last Friday. This occurred around 9 PM. He had just been to dinner and had about one half beers and on driving home with a friend and he had some nausea without vomiting. He recalls feeling sweaty and clammy and then apparently ended up on the wrong side of the road. It was not clear that he fully lost consciousness as he was able pull car over and put in park. He got out of the car and went down to the ground but did not have any clear total loss of consciousness according to his friend. No history of seizure and no observed seizure activity. He had no chest pains. No dyspnea. No associated cough.  He does have hypertension treated with low-dose lisinopril. No recent orthostasis. He did not have any confusion afterwards her post ictal type symptoms. He's had no symptoms of dizziness or recurrent syncope since then. He's exercising without difficulty. He had massage earlier in the day was concerned that it may been related. No history of vasovagal syncope. He is not aware of any recent palpitations. No illicit drug use. Patient states he only consumed about 18 ounces of beer and was not intoxicated during episode above. No regular use of alcohol  No past medical history on file. Past Surgical History  Procedure Laterality Date  . Achilles tendon surgery  02/02/03    left    reports that he has never smoked. He does not have any smokeless tobacco history on file. He reports that he drinks alcohol. He reports that he does not use illicit drugs. family history includes Cancer in his mother and paternal grandfather. No Known Allergies    Review of Systems  Constitutional: Negative for fever, chills, appetite change and unexpected weight change.  Respiratory: Negative for cough, shortness of breath and wheezing.   Cardiovascular:  Negative for chest pain, palpitations and leg swelling.  Gastrointestinal: Negative for abdominal pain.  Endocrine: Negative for polydipsia and polyuria.  Genitourinary: Negative for dysuria.  Neurological: Positive for syncope. Negative for seizures and headaches.  Psychiatric/Behavioral: Negative for confusion.       Objective:   Physical Exam  Constitutional: He is oriented to person, place, and time. He appears well-developed and well-nourished. No distress.  HENT:  Mouth/Throat: Oropharynx is clear and moist.  Eyes: Pupils are equal, round, and reactive to light.  Neck: Neck supple. No thyromegaly present.  Cardiovascular: Normal rate and regular rhythm.  Exam reveals no gallop.   No murmur heard. Pulmonary/Chest: Effort normal and breath sounds normal. No respiratory distress. He has no wheezes. He has no rales.  Musculoskeletal: He exhibits no edema.  Lymphadenopathy:    He has no cervical adenopathy.  Neurological: He is alert and oriented to person, place, and time. No cranial nerve deficit. Coordination normal.  No focal weakness. Cerebellar function normal by finger to nose testing.  Psychiatric: He has a normal mood and affect. His behavior is normal. Thought content normal.          Assessment & Plan:  Recent transient altered consciousness. Not clear if this was true syncope. Highly suspect based on history this was vasovagal. He had some nausea and diaphoresis which preceded his episode above. By history, this is not sound highly suspicious for seizure. Check labs with CBC and comprehensive metabolic panel. Check EKG. Consider EEG  He does not have any heart murmurs or other worrisome physical findings  EKG sinus rhythm with rate of 59 with no acute ST-T changes

## 2014-04-24 NOTE — Patient Instructions (Signed)
Syncope °Syncope is a medical term for fainting or passing out. This means you lose consciousness and drop to the ground. People are generally unconscious for less than 5 minutes. You may have some muscle twitches for up to 15 seconds before waking up and returning to normal. Syncope occurs more often in older adults, but it can happen to anyone. While most causes of syncope are not dangerous, syncope can be a sign of a serious medical problem. It is important to seek medical care.  °CAUSES  °Syncope is caused by a sudden drop in blood flow to the brain. The specific cause is often not determined. Factors that can bring on syncope include: °· Taking medicines that lower blood pressure. °· Sudden changes in posture, such as standing up quickly. °· Taking more medicine than prescribed. °· Standing in one place for too long. °· Seizure disorders. °· Dehydration and excessive exposure to heat. °· Low blood sugar (hypoglycemia). °· Straining to have a bowel movement. °· Heart disease, irregular heartbeat, or other circulatory problems. °· Fear, emotional distress, seeing blood, or severe pain. °SYMPTOMS  °Right before fainting, you may: °· Feel dizzy or light-headed. °· Feel nauseous. °· See all white or all black in your field of vision. °· Have cold, clammy skin. °DIAGNOSIS  °Your health care provider will ask about your symptoms, perform a physical exam, and perform an electrocardiogram (ECG) to record the electrical activity of your heart. Your health care provider may also perform other heart or blood tests to determine the cause of your syncope which may include: °· Transthoracic echocardiogram (TTE). During echocardiography, sound waves are used to evaluate how blood flows through your heart. °· Transesophageal echocardiogram (TEE). °· Cardiac monitoring. This allows your health care provider to monitor your heart rate and rhythm in real time. °· Holter monitor. This is a portable device that records your  heartbeat and can help diagnose heart arrhythmias. It allows your health care provider to track your heart activity for several days, if needed. °· Stress tests by exercise or by giving medicine that makes the heart beat faster. °TREATMENT  °In most cases, no treatment is needed. Depending on the cause of your syncope, your health care provider may recommend changing or stopping some of your medicines. °HOME CARE INSTRUCTIONS °· Have someone stay with you until you feel stable. °· Do not drive, use machinery, or play sports until your health care provider says it is okay. °· Keep all follow-up appointments as directed by your health care provider. °· Lie down right away if you start feeling like you might faint. Breathe deeply and steadily. Wait until all the symptoms have passed. °· Drink enough fluids to keep your urine clear or pale yellow. °· If you are taking blood pressure or heart medicine, get up slowly and take several minutes to sit and then stand. This can reduce dizziness. °SEEK IMMEDIATE MEDICAL CARE IF:  °· You have a severe headache. °· You have unusual pain in the chest, abdomen, or back. °· You are bleeding from your mouth or rectum, or you have black or tarry stool. °· You have an irregular or very fast heartbeat. °· You have pain with breathing. °· You have repeated fainting or seizure-like jerking during an episode. °· You faint when sitting or lying down. °· You have confusion. °· You have trouble walking. °· You have severe weakness. °· You have vision problems. °If you fainted, call your local emergency services (911 in [place] Do not drive   yourself to the hospital.  °MAKE SURE YOU: °· Understand these instructions. °· Will watch your condition. °· Will get help right away if you are not doing well or get worse. °Document Released: 01/18/2005 Document Revised: 01/23/2013 Document Reviewed: 03/19/2011 °ExitCare® Patient Information ©2015 [employer]. This information is not intended to replace  advice given to you by your health care provider. Make sure you discuss any questions you have with your health care provider. ° °

## 2014-04-24 NOTE — Progress Notes (Signed)
Pre visit review using our clinic review tool, if applicable. No additional management support is needed unless otherwise documented below in the visit note. 

## 2014-05-06 ENCOUNTER — Other Ambulatory Visit: Payer: 59

## 2014-05-09 ENCOUNTER — Ambulatory Visit (INDEPENDENT_AMBULATORY_CARE_PROVIDER_SITE_OTHER): Payer: 59 | Admitting: Neurology

## 2014-05-09 DIAGNOSIS — R404 Transient alteration of awareness: Secondary | ICD-10-CM | POA: Diagnosis not present

## 2014-05-09 NOTE — Procedures (Signed)
ELECTROENCEPHALOGRAM REPORT  Date of Study: 05/09/2014  Patient's Name: Garrett Anderson MRN: [id number] Date of Birth: 1975/10/20  Referring Provider: Dr. Evelena PeatBruce Burchette  Clinical History: This is a 39 year old man with a brief episode of alteration of consciousness  Medications: Lisinopril  Technical Summary: A multichannel digital EEG recording measured by the international 10-20 system with electrodes applied with paste and impedances below 5000 ohms performed in our laboratory with EKG monitoring in an awake and asleep patient.  Hyperventilation and photic stimulation were performed.  The digital EEG was referentially recorded, reformatted, and digitally filtered in a variety of bipolar and referential montages for optimal display.    Description: The patient is awake and asleep during the recording.  During maximal wakefulness, there is a symmetric, medium voltage 10 Hz posterior dominant rhythm that attenuates with eye opening.  The record is symmetric.  During drowsiness and sleep, there is an increase in theta slowing of the background.  Vertex waves and symmetric sleep spindles were seen.  Hyperventilation and photic stimulation did not elicit any abnormalities.  There were no epileptiform discharges or electrographic seizures seen.    EKG lead was unremarkable.  Impression: This awake and asleep EEG is normal.    Clinical Correlation: A normal EEG does not exclude a clinical diagnosis of epilepsy. Clinical correlation is advised.   Patrcia DollyKaren Carrel Leather, M.D.

## 2015-01-06 ENCOUNTER — Other Ambulatory Visit: Payer: Self-pay | Admitting: Family Medicine

## 2015-01-06 MED ORDER — LISINOPRIL-HYDROCHLOROTHIAZIDE 10-12.5 MG PO TABS: ORAL_TABLET | ORAL | Status: DC

## 2015-01-13 ENCOUNTER — Ambulatory Visit (INDEPENDENT_AMBULATORY_CARE_PROVIDER_SITE_OTHER): Payer: Commercial Managed Care - HMO | Admitting: Family Medicine

## 2015-01-13 ENCOUNTER — Encounter: Payer: Self-pay | Admitting: Family Medicine

## 2015-01-13 VITALS — BP 120/84 | Temp 98.5°F | Wt 215.0 lb

## 2015-01-13 DIAGNOSIS — H60332 Swimmer's ear, left ear: Secondary | ICD-10-CM | POA: Diagnosis not present

## 2015-01-13 MED ORDER — NEOMYCIN-POLYMYXIN-HC 1 % OT SOLN: OTIC | Status: DC

## 2015-01-13 NOTE — Patient Instructions (Signed)
2 drops in your left ear canal bedtime,,,,,,,,, pack with cotton,,,,,, remove the cotton in the morning and leave it open to the air  Return the 20th or the 21st for follow-up

## 2015-01-13 NOTE — Progress Notes (Signed)
Pre visit review using our clinic review tool, if applicable. No additional management support is needed unless otherwise documented below in the visit note. 

## 2015-01-13 NOTE — Progress Notes (Signed)
   Subjective:    Patient ID: Garrett Anderson, male    DOB: 1975-12-19, 39 y.o.   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 No history of trauma. No history of any antecedent illness. He said she's had trouble with earwax in the past   Review of Systems Review of systems negative    Objective:   Physical Exam  Well-developed well-nourished male no acute distress vital signs stable he is afebrile  HEENT negative except for swimmer's ear left ear      Assessment & Plan:  Swimmer's ear left ear,,,,,,,,,,,,,,,,

## 2015-01-22 ENCOUNTER — Ambulatory Visit: Payer: Commercial Managed Care - HMO | Admitting: Family Medicine

## 2015-02-12 ENCOUNTER — Ambulatory Visit (INDEPENDENT_AMBULATORY_CARE_PROVIDER_SITE_OTHER): Payer: Commercial Managed Care - HMO | Admitting: Family Medicine

## 2015-02-12 ENCOUNTER — Encounter: Payer: Self-pay | Admitting: Family Medicine

## 2015-02-12 VITALS — BP 130/80 | Temp 98.7°F | Wt 218.0 lb

## 2015-02-12 DIAGNOSIS — H60332 Swimmer's ear, left ear: Secondary | ICD-10-CM

## 2015-02-12 NOTE — Progress Notes (Signed)
   Subjective:    Patient ID: Barbette MerinoHassan Spoelstra, male    DOB: [birth year], 40 y.o.   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 Required oral antibiotics and drops to resolve the infection. He's here today for follow-up feeling well   Review of Systems Review of systems negative    Objective:   Physical Exam  Well-developed well-nourished male no acute distress vital signs stable he is afebrile ENT negative except for bilateral cerumen impactions which were relieved by irrigation.      Assessment & Plan:  Left otitis externa resolved..........Marland Kitchen. ear wax removed.......... advise good ear hygiene flushing with the home drops about every 6 months not using Q-tips return when necessary

## 2015-02-12 NOTE — Progress Notes (Signed)
Pre visit review using our clinic review tool, if applicable. No additional management support is needed unless otherwise documented below in the visit note. 

## 2015-11-03 ENCOUNTER — Other Ambulatory Visit (INDEPENDENT_AMBULATORY_CARE_PROVIDER_SITE_OTHER): Payer: Commercial Managed Care - HMO

## 2015-11-03 DIAGNOSIS — Z0000 Encounter for general adult medical examination without abnormal findings: Secondary | ICD-10-CM

## 2015-11-03 LAB — CBC WITH DIFFERENTIAL/PLATELET
Basophils Absolute: 0 10*3/uL (ref 0.0–0.1)
Basophils Relative: 0.5 % (ref 0.0–3.0)
EOS ABS: 0.2 10*3/uL (ref 0.0–0.7)
Eosinophils Relative: 5.1 % — ABNORMAL HIGH (ref 0.0–5.0)
HEMATOCRIT: 43.5 % (ref 39.0–52.0)
Hemoglobin: 14.8 g/dL (ref 13.0–17.0)
LYMPHS PCT: 36.6 % (ref 12.0–46.0)
Lymphs Abs: 1.7 10*3/uL (ref 0.7–4.0)
MCHC: 34.1 g/dL (ref 30.0–36.0)
MCV: 86.4 fl (ref 78.0–100.0)
MONO ABS: 0.5 10*3/uL (ref 0.1–1.0)
Monocytes Relative: 10 % (ref 3.0–12.0)
NEUTROS ABS: 2.2 10*3/uL (ref 1.4–7.7)
Neutrophils Relative %: 47.8 % (ref 43.0–77.0)
Platelets: 210 10*3/uL (ref 150.0–400.0)
RBC: 5.03 Mil/uL (ref 4.22–5.81)
RDW: 13.5 % (ref 11.5–15.5)
WBC: 4.7 10*3/uL (ref 4.0–10.5)

## 2015-11-03 LAB — HEPATIC FUNCTION PANEL
ALT: 16 U/L (ref 0–53)
AST: 15 U/L (ref 0–37)
Albumin: 3.7 g/dL (ref 3.5–5.2)
Alkaline Phosphatase: 60 U/L (ref 39–117)
BILIRUBIN DIRECT: 0.1 mg/dL (ref 0.0–0.3)
TOTAL PROTEIN: 6.8 g/dL (ref 6.0–8.3)
Total Bilirubin: 0.4 mg/dL (ref 0.2–1.2)

## 2015-11-03 LAB — POC URINALSYSI DIPSTICK (AUTOMATED)
BILIRUBIN UA: NEGATIVE
Blood, UA: NEGATIVE
Glucose, UA: NEGATIVE
KETONES UA: NEGATIVE
LEUKOCYTES UA: NEGATIVE
Nitrite, UA: NEGATIVE
PH UA: 7
SPEC GRAV UA: 1.02
Urobilinogen, UA: 1

## 2015-11-03 LAB — BASIC METABOLIC PANEL
BUN: 8 mg/dL (ref 6–23)
CHLORIDE: 104 meq/L (ref 96–112)
CO2: 30 meq/L (ref 19–32)
Calcium: 8.8 mg/dL (ref 8.4–10.5)
Creatinine, Ser: 1.01 mg/dL (ref 0.40–1.50)
GFR: 105.14 mL/min (ref >60.00)
Glucose, Bld: 88 mg/dL (ref 70–99)
Potassium: 4.1 mEq/L (ref 3.5–5.1)
Sodium: 140 mEq/L (ref 135–145)

## 2015-11-03 LAB — LIPID PANEL
CHOL/HDL RATIO: 4
CHOLESTEROL: 168 mg/dL (ref 0–200)
HDL: 45.8 mg/dL (ref >39.00)
LDL Cholesterol: 112 mg/dL — ABNORMAL HIGH (ref 0–99)
NonHDL: 121.88
TRIGLYCERIDES: 51 mg/dL (ref 0.0–149.0)
VLDL: 10.2 mg/dL (ref 0.0–40.0)

## 2015-11-03 LAB — PSA: PSA: 0.42 ng/mL (ref 0.10–4.00)

## 2015-11-03 LAB — TSH: TSH: 0.91 u[IU]/mL (ref 0.35–4.50)

## 2015-11-10 ENCOUNTER — Encounter: Payer: Self-pay | Admitting: Family Medicine

## 2015-11-10 ENCOUNTER — Ambulatory Visit (INDEPENDENT_AMBULATORY_CARE_PROVIDER_SITE_OTHER): Payer: Commercial Managed Care - HMO | Admitting: Family Medicine

## 2015-11-10 VITALS — BP 128/68 | HR 78 | Temp 98.2°F | Resp 16 | Ht 66.75 in | Wt 205.1 lb

## 2015-11-10 DIAGNOSIS — I10 Essential (primary) hypertension: Secondary | ICD-10-CM | POA: Diagnosis not present

## 2015-11-10 DIAGNOSIS — Z0000 Encounter for general adult medical examination without abnormal findings: Secondary | ICD-10-CM | POA: Diagnosis not present

## 2015-11-10 DIAGNOSIS — Z23 Encounter for immunization: Secondary | ICD-10-CM

## 2015-11-10 MED ORDER — LISINOPRIL-HYDROCHLOROTHIAZIDE 10-12.5 MG PO TABS: ORAL_TABLET | ORAL | 3 refills | Status: DC

## 2015-11-10 NOTE — Patient Instructions (Signed)
Continue good health habits  Return in one year for general physical exam sooner if any problems 

## 2015-11-10 NOTE — Progress Notes (Signed)
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 He's due to go back for follow-up when these 40  He had a prostate exam by urologist couple years ago because of prostatitis. He was given an antibiotic. He has a difficulty with nocturia. 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 3 children,,,,, stepchildren,,,,,,, grown and gone. 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 Cardiopulmonary exam normal Dahms abnormal extremity still skin no peripheral pulses normal.  Impression  #1 hypertension at goal.......... continue current therapy  #2... Cerumen impaction left ear......Marland Kitchen. removed by her dictation and suction  #3 family history of colon cancer..... Maternal grandfather........Marland Kitchen. mother has polyps.... Continue close GI surveillance as outlined  #4 normal PSA.

## 2016-03-31 NOTE — Progress Notes (Signed)
  Pre visit review using our clinic review tool, if applicable. No additional management support is needed unless otherwise documented below in the visit note.  Chief Complaint  Patient presents with  . Rt Ear Pain    Hx of cerumen impaction in the past.    HPI: Garrett Anderson 41 y.o.  sda PCP NA  Hx ear wax issues as a child and now  Left ear rx with  drops  rx and now  Right ear had 1 week of tenderness   And  Swollen.   tunner feelking  .    Pain   Onset   Week  Ago .  nio fever water manipulation ocass itch. Uses ear buds . No fever uri  ROS: See pertinent positives and negatives per HPI.  No past medical history on file.  Family History  Problem Relation Age of Onset  . Cancer Mother     lung  . Cancer Paternal Grandfather     Social History   Social History  . Marital status: Married    Spouse name: N/A  . Number of children: N/A  . Years of education: N/A   Social History Main Topics  . Smoking status: Never Smoker  . Smokeless tobacco: Never Used  . Alcohol use Yes  . Drug use: No  . Sexual activity: Yes   Other Topics Concern  . 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 aspirin 81 MG tablet Take 81 mg by mouth daily.    Marland Kitchen. lisinopril-hydrochlorothiazide (PRINZIDE,ZESTORETIC) 10-12.5 MG tablet TAKE ONE-HALF TABLET BY MOUTH ONCE DAILY AS NEEDED 90 tablet 3  . NEOMYCIN-POLYMYXIN-HYDROCORTISONE (CORTISPORIN) 1 % SOLN otic solution 2 drops left ear canal bedtime for 10 nights 10 mL 2   No facility-administered medications prior to visit.      EXAM:  BP 124/76 (BP Location: Right Arm, Patient Position: Sitting, Cuff Size: Large)   Temp 98.4 F (36.9 C) (Oral)   Wt 210 lb (95.3 kg)   BMI 33.14 kg/m   Body mass index is 33.14 kg/m.  GENERAL: vitals reviewed and listed above, alert, oriented, appears well hydrated and in no acute distress HEENT:  atraumatic, conjunctiva  clear, no obvious abnormalities on inspection of external nose and ears  Right eac tender pinna pull narrow ear canal light wax and  1 +  Swelling.   No adenopathy but tender infra node / OP : no lesion edema or exudate  Left eac mild wax tm visualslized and no redness  NECK: no obvious masses on inspection palpation  MS: moves all extremities without noticeable focal  abnormality PSYCH: pleasant and cooperative, no obvious depression or anxiety  ASSESSMENT AND PLAN:  Discussed the following assessment and plan:  Right ear pain  Acute otitis externa of right ear, unspecified type  Impacted cerumen of right ear Small ear canal  Use drops for 1 weeks and then ov and poss irrigation or other  To vies  -Patient advised to return or notify health care team  if symptoms worsen ,persist or new concerns arise.  Patient Instructions   Beginning eardrops as we discussed. Make appointment with Dr. Tawanna Coolerodd for next week.. Looks like you have some wax but possibly some infection also. Contact us earlier if you get fever or facial swelling.      Neta MendsWanda K. Panosh M.D.

## 2016-04-01 ENCOUNTER — Encounter: Payer: Self-pay | Admitting: Internal Medicine

## 2016-04-01 ENCOUNTER — Ambulatory Visit (INDEPENDENT_AMBULATORY_CARE_PROVIDER_SITE_OTHER): Payer: Commercial Managed Care - HMO | Admitting: Internal Medicine

## 2016-04-01 VITALS — BP 124/76 | Temp 98.4°F | Wt 210.0 lb

## 2016-04-01 DIAGNOSIS — H6121 Impacted cerumen, right ear: Secondary | ICD-10-CM | POA: Diagnosis not present

## 2016-04-01 DIAGNOSIS — H9201 Otalgia, right ear: Secondary | ICD-10-CM

## 2016-04-01 DIAGNOSIS — H60501 Unspecified acute noninfective otitis externa, right ear: Secondary | ICD-10-CM | POA: Diagnosis not present

## 2016-04-01 MED ORDER — NEOMYCIN-POLYMYXIN-HC 1 % OT SOLN: OTIC | 2 refills | Status: DC

## 2016-04-01 NOTE — Patient Instructions (Signed)
  Beginning eardrops as we discussed. Make appointment with Dr. Tawanna Coolerodd for next week.. Looks like you have some wax but possibly some infection also. Contact us earlier if you get fever or facial swelling.

## 2016-10-22 ENCOUNTER — Encounter: Payer: Self-pay | Admitting: Family Medicine

## 2017-01-04 ENCOUNTER — Encounter: Payer: Self-pay | Admitting: Internal Medicine

## 2017-01-04 ENCOUNTER — Ambulatory Visit: Payer: 59 | Admitting: Internal Medicine

## 2017-01-04 VITALS — BP 146/84 | HR 82 | Temp 98.2°F | Ht 66.0 in | Wt 207.2 lb

## 2017-01-04 DIAGNOSIS — H60332 Swimmer's ear, left ear: Secondary | ICD-10-CM

## 2017-01-04 DIAGNOSIS — I10 Essential (primary) hypertension: Secondary | ICD-10-CM

## 2017-01-04 MED ORDER — CIPROFLOXACIN-DEXAMETHASONE 0.3-0.1 % OT SUSP: 4.0000 [drp] | Freq: Two times a day (BID) | OTIC | 0 refills | Status: DC

## 2017-01-04 NOTE — Patient Instructions (Addendum)
Otitis Externa Otitis externa is an infection of the outer ear canal. The outer ear canal is the area between the outside of the ear and the eardrum. Otitis externa is sometimes called "swimmer's ear." Follow these instructions at home:  If you were given antibiotic ear drops, use them as told by your doctor. Do not stop using them even if your condition gets better.  Take over-the-counter and prescription medicines only as told by your doctor.  Keep all follow-up visits as told by your doctor. This is important. How is this prevented?  Keep your ear dry. Use the corner of a towel to dry your ear after you swim or bathe.  Try not to scratch or put things in your ear. Doing these things makes it easier for germs to grow in your ear.  Avoid swimming in lakes, dirty water, or pools that may not have the right amount of a chemical called chlorine.  Consider making ear drops and putting 3 or 4 drops in each ear after you swim. Ask your doctor about how you can make ear drops. Contact a doctor if:  You have a fever.  After 3 days your ear is still red, swollen, or painful.  After 3 days you still have pus coming from your ear.  Your redness, swelling, or pain gets worse.  You have a really bad headache.  You have redness, swelling, pain, or tenderness behind your ear. This information is not intended to replace advice given to you by your health care provider. Make sure you discuss any questions you have with your health care provider. Document Released: 07/07/2007 Document Revised: 02/13/2015 Document Reviewed: 10/28/2014 Elsevier Interactive Patient Education  2018 [employer]  Please check your blood pressure on a regular basis.  If it is consistently greater than 140/90, please make an office appointment.

## 2017-01-04 NOTE — Progress Notes (Signed)
Subjective:    Patient ID: Garrett Anderson, male    DOB: 1975/09/11, 41 y.o.   MRN: [id number]  HPI  41 year old patient who has a history of recurrent otitis externa as well as issues with cerumen impactions.  He states that he has had these issues intermittently since his childhood. More recently he has had increasing hearing loss and mild discomfort involving the left ear.  Symptoms are intermittent.  He has been using Cortisporin otic solution intermittently  History reviewed. No pertinent past medical history.   Social History   Socioeconomic History  . Marital status: Married    Spouse name: Not on file  . Number of children: Not on file  . Years of education: Not on file  . Highest education level: Not on file  Social Needs  . Financial resource strain: Not on file  . Food insecurity - worry: Not on file  . Food insecurity - inability: Not on file  . Transportation needs - medical: Not on file  . Transportation needs - non-medical: Not on file  Occupational History  . Not on file  Tobacco Use  . Smoking status: Never Smoker  . Smokeless tobacco: Never Used  Substance and Sexual Activity  . Alcohol use: Yes  . Drug use: No  . Sexual activity: Yes  Other Topics Concern  . Not on file  Social History Narrative   Married   Theatre stage managerire fighter - [place]    Past Surgical History:  Procedure Laterality Date  . ACHILLES TENDON SURGERY  02/02/03   left    Family History  Problem Relation Age of Onset  . Cancer Mother        lung  . Cancer Paternal Grandfather     No Known Allergies  Current Outpatient Medications on File Prior to Visit  Medication Sig Dispense Refill  . aspirin 81 MG tablet Take 81 mg by mouth daily.    Marland Kitchen. lisinopril-hydrochlorothiazide (PRINZIDE,ZESTORETIC) 10-12.5 MG tablet TAKE ONE-HALF TABLET BY MOUTH ONCE DAILY AS NEEDED 90 tablet 3  . NEOMYCIN-POLYMYXIN-HYDROCORTISONE (CORTISPORIN) 1 % SOLN otic solution 4  drops right  ear canal  Twice a day   Or as directed (Patient not taking: Reported on 01/04/2017) 10 mL 2   No current facility-administered medications on file prior to visit.     BP (!) 146/84 (BP Location: Left Arm, Patient Position: Sitting, Cuff Size: Normal)   Pulse 82   Temp 98.2 F (36.8 C) (Oral)   Ht 5\' 6"  (1.676 m)   Wt 207 lb 3.2 oz (94 kg)   SpO2 97%   BMI 33.44 kg/m     Review of Systems  Constitutional: Negative for appetite change, chills, fatigue and fever.  HENT: Positive for ear pain and hearing loss. Negative for congestion, dental problem, sore throat, tinnitus, trouble swallowing and voice change.   Eyes: Negative for pain, discharge and visual disturbance.  Respiratory: Negative for cough, chest tightness, wheezing and stridor.   Cardiovascular: Negative for chest pain, palpitations and leg swelling.  Gastrointestinal: Negative for abdominal distention, abdominal pain, blood in stool, constipation, diarrhea, nausea and vomiting.  Genitourinary: Negative for difficulty urinating, discharge, flank pain, genital sores, hematuria and urgency.  Musculoskeletal: Negative for arthralgias, back pain, gait problem, joint swelling, myalgias and neck stiffness.  Skin: Negative for rash.  Neurological: Negative for dizziness, syncope, speech difficulty, weakness, numbness and headaches.  Hematological: Negative for adenopathy. Does not bruise/bleed easily.  Psychiatric/Behavioral: Negative for behavioral problems and dysphoric mood. The patient  is not nervous/anxious.        Objective:   Physical Exam  Constitutional: He appears well-developed and well-nourished. No distress.  Blood pressure 140/82  HENT:  Both canals are narrow Left canal is erythematous and slightly edematous. Chronic changes of the left TM            Assessment & Plan:   Recurrent left otitis externa.  Will treat with ciprofloxacin-dexamethasone otic solution twice daily Essential hypertension.  Home blood pressure  monitoring recommended follow-up PCP 6 months  Anderson,Garrett Garrett FellersFRANK

## 2017-04-19 ENCOUNTER — Ambulatory Visit: Payer: 59 | Admitting: Family Medicine

## 2017-06-06 ENCOUNTER — Ambulatory Visit: Payer: 59 | Admitting: Family Medicine

## 2017-06-06 ENCOUNTER — Encounter: Payer: Self-pay | Admitting: Family Medicine

## 2017-06-06 VITALS — BP 136/84 | HR 86 | Temp 98.5°F | Wt 205.0 lb

## 2017-06-06 DIAGNOSIS — G44009 Cluster headache syndrome, unspecified, not intractable: Secondary | ICD-10-CM

## 2017-06-06 MED ORDER — PREDNISONE 20 MG PO TABS: ORAL_TABLET | ORAL | 1 refills | Status: DC

## 2017-06-06 NOTE — Progress Notes (Signed)
Garrett Anderson is a 42 year old married male nonsmoker comes in today for evaluation of a week's worth of headaches  He states it started last Monday with a bifrontal headache. Last for couple hours and went away. The next day Tuesday had the same type headache this time became nauseated. Again it last for couple hours and went away. In Wednesday at the same type of headache this time he felt lightheaded and dizzy. No vomiting. Again it lasted for an hour to went away. Checked his blood pressures 150/94. There is a family history of hypertension  He is a mild headache yesterday but today has had no headache.  He's never had headaches like this in the past  Neurologic review of systems negative  Family history pertinent his mother has had headaches which sound like migraine.  He takes lisinopril 1 daily because a history of hypertension.  BP by me right arm sitting position 130/82  No history of trauma allergic rhinitis etc. etc.  BP 136/84 (BP Location: Left Arm, Patient Position: Sitting, Cuff Size: Large)   Pulse 86   Temp 98.5 F (36.9 C) (Oral)   Wt 205 lb (93 kg)   BMI 33.09 kg/m  Well-developed well-nourished male no acute distress vital signs stable he is afebrile HEENT were negative  BP right arm sitting position 130/82  Cluster migraines  #1 cluster migraines......Marland Kitchen present son burst and taper

## 2017-06-06 NOTE — Patient Instructions (Signed)
Prednisone 20 mg.......Marland Kitchen 1 tab 5 days, half a tab 5 days, then a half a tab Monday Wednesday Friday for a two-week taper  Return when necessary

## 2017-10-11 DIAGNOSIS — H35033 Hypertensive retinopathy, bilateral: Secondary | ICD-10-CM | POA: Diagnosis not present

## 2017-11-16 ENCOUNTER — Encounter: Payer: 59 | Admitting: Family Medicine

## 2017-12-08 ENCOUNTER — Other Ambulatory Visit: Payer: Self-pay | Admitting: Family Medicine

## 2017-12-08 MED ORDER — LISINOPRIL-HYDROCHLOROTHIAZIDE 10-12.5 MG PO TABS: ORAL_TABLET | ORAL | 0 refills | Status: DC

## 2017-12-08 NOTE — Telephone Encounter (Signed)
Requested medication (s) are due for refill today: yes  Requested medication (s) are on the active medication list: yes    Last refill: 11/10/15  #90  3 refills  Future visit scheduled yes   12/21/17  Dr. Salomon Fick  Notes to clinic:Labs due   Has appt next week  Requested Prescriptions  Pending Prescriptions Disp Refills   lisinopril-hydrochlorothiazide (PRINZIDE,ZESTORETIC) 10-12.5 MG tablet 90 tablet 3    Sig: TAKE ONE-HALF TABLET BY MOUTH ONCE DAILY AS NEEDED     Cardiovascular:  ACEI + Diuretic Combos Failed - 12/08/2017 10:50 AM      Failed - Na in normal range and within 180 days    Sodium  Date Value Ref Range Status  11/03/2015 140 135 - 145 mEq/L Final         Failed - K in normal range and within 180 days    Potassium  Date Value Ref Range Status  11/03/2015 4.1 3.5 - 5.1 mEq/L Final         Failed - Cr in normal range and within 180 days    Creatinine, Ser  Date Value Ref Range Status  11/03/2015 1.01 0.40 - 1.50 mg/dL Final         Failed - Ca in normal range and within 180 days    Calcium  Date Value Ref Range Status  11/03/2015 8.8 8.4 - 10.5 mg/dL Final         Failed - Valid encounter within last 6 months    Recent Outpatient Visits          6 months ago Migraine-cluster headache syndrome   [facility] officer at [facility], Eugenio Hoes, MD   11 months ago Acute swimmer's ear of left side   Nature conservation officer at [facility], Janett Labella, MD   1 year ago Right ear pain   Nature conservation officer at [facility], Neta Mends, MD   2 years ago Essential hypertension   Nature conservation officer at [employer] [facility], Eugenio Hoes, MD   2 years ago Swimmer's ear of left side   Nature conservation officer at [employer], Eugenio Hoes, MD      Future Appointments            In 1 week Deeann Saint, MD [facility] [place], [facility]           Passed - Patient is not pregnant      Passed - Last BP in normal range    BP Readings from Last 1 Encounters:   06/06/17 136/84

## 2017-12-08 NOTE — Telephone Encounter (Signed)
Copied from CRM [id number]. Topic: Quick Communication - Rx Refill/Question >> Dec 08, 2017 10:44 AM Burchel, Abbi R wrote: Medication: lisinopril-hydrochlorothiazide (PRINZIDE,ZESTORETIC) 10-12.5 MG tablet    Preferred Pharmacy: [facility] [address] [place], [place] - [address] [place] [address] Phone: [phone] Fax: [phone]     Pt was advised that RX refills may take up to 3 business days. We ask that you follow-up with your pharmacy.

## 2017-12-21 ENCOUNTER — Ambulatory Visit: Payer: 59 | Admitting: Family Medicine

## 2017-12-21 ENCOUNTER — Encounter: Payer: Self-pay | Admitting: Family Medicine

## 2017-12-21 VITALS — BP 120/78 | HR 69 | Temp 98.0°F | Ht 68.0 in | Wt 211.0 lb

## 2017-12-21 DIAGNOSIS — I10 Essential (primary) hypertension: Secondary | ICD-10-CM

## 2017-12-21 NOTE — Patient Instructions (Signed)
Managing Your Hypertension Hypertension is commonly called high blood pressure. This is when the force of your blood pressing against the walls of your arteries is too strong. Arteries are blood vessels that carry blood from your heart throughout your body. Hypertension forces the heart to work harder to pump blood, and may cause the arteries to become narrow or stiff. Having untreated or uncontrolled hypertension can cause heart attack, stroke, kidney disease, and other problems. What are blood pressure readings? A blood pressure reading consists of a higher number over a lower number. Ideally, your blood pressure should be below 120/80. The first ("top") number is called the systolic pressure. It is a measure of the pressure in your arteries as your heart beats. The second ("bottom") number is called the diastolic pressure. It is a measure of the pressure in your arteries as the heart relaxes. What does my blood pressure reading mean? Blood pressure is classified into four stages. Based on your blood pressure reading, your health care provider may use the following stages to determine what type of treatment you need, if any. Systolic pressure and diastolic pressure are measured in a unit called mm Hg. Normal  Systolic pressure: below 120.  Diastolic pressure: below 80. Elevated  Systolic pressure: 120-129.  Diastolic pressure: below 80. Hypertension stage 1  Systolic pressure: 130-139.  Diastolic pressure: 80-89. Hypertension stage 2  Systolic pressure: 140 or above.  Diastolic pressure: 90 or above. What health risks are associated with hypertension? Managing your hypertension is an important responsibility. Uncontrolled hypertension can lead to:  A heart attack.  A stroke.  A weakened blood vessel (aneurysm).  Heart failure.  Kidney damage.  Eye damage.  Metabolic syndrome.  Memory and concentration problems.  What changes can I make to manage my  hypertension? Hypertension can be managed by making lifestyle changes and possibly by taking medicines. Your health care provider will help you make a plan to bring your blood pressure within a normal range. Eating and drinking  Eat a diet that is high in fiber and potassium, and low in salt (sodium), added sugar, and fat. An example eating plan is called the DASH (Dietary Approaches to Stop Hypertension) diet. To eat this way: ? Eat plenty of fresh fruits and vegetables. Try to fill half of your plate at each meal with fruits and vegetables. ? Eat whole grains, such as whole wheat pasta, brown rice, or whole grain bread. Fill about one quarter of your plate with whole grains. ? Eat low-fat diary products. ? Avoid fatty cuts of meat, processed or cured meats, and poultry with skin. Fill about one quarter of your plate with lean proteins such as fish, chicken without skin, beans, eggs, and tofu. ? Avoid premade and processed foods. These tend to be higher in sodium, added sugar, and fat.  Reduce your daily sodium intake. Most people with hypertension should eat less than 1,500 mg of sodium a day.  Limit alcohol intake to no more than 1 drink a day for nonpregnant women and 2 drinks a day for men. One drink equals 12 oz of beer, 5 oz of wine, or 1 oz of hard liquor. Lifestyle  Work with your health care provider to maintain a healthy body weight, or to lose weight. Ask what an ideal weight is for you.  Get at least 30 minutes of exercise that causes your heart to beat faster (aerobic exercise) most days of the week. Activities may include walking, swimming, or biking.  Include exercise   to strengthen your muscles (resistance exercise), such as weight lifting, as part of your weekly exercise routine. Try to do these types of exercises for 30 minutes at least 3 days a week.  Do not use any products that contain nicotine or tobacco, such as cigarettes and e-cigarettes. If you need help quitting, ask  your health care provider.  Control any long-term (chronic) conditions you have, such as high cholesterol or diabetes. Monitoring  Monitor your blood pressure at home as told by your health care provider. Your personal target blood pressure may vary depending on your medical conditions, your age, and other factors.  Have your blood pressure checked regularly, as often as told by your health care provider. Working with your health care provider  Review all the medicines you take with your health care provider because there may be side effects or interactions.  Talk with your health care provider about your diet, exercise habits, and other lifestyle factors that may be contributing to hypertension.  Visit your health care provider regularly. Your health care provider can help you create and adjust your plan for managing hypertension. Will I need medicine to control my blood pressure? Your health care provider may prescribe medicine if lifestyle changes are not enough to get your blood pressure under control, and if:  Your systolic blood pressure is 130 or higher.  Your diastolic blood pressure is 80 or higher.  Take medicines only as told by your health care provider. Follow the directions carefully. Blood pressure medicines must be taken as prescribed. The medicine does not work as well when you skip doses. Skipping doses also puts you at risk for problems. Contact a health care provider if:  You think you are having a reaction to medicines you have taken.  You have repeated (recurrent) headaches.  You feel dizzy.  You have swelling in your ankles.  You have trouble with your vision. Get help right away if:  You develop a severe headache or confusion.  You have unusual weakness or numbness, or you feel faint.  You have severe pain in your chest or abdomen.  You vomit repeatedly.  You have trouble breathing. Summary  Hypertension is when the force of blood pumping through  your arteries is too strong. If this condition is not controlled, it may put you at risk for serious complications.  Your personal target blood pressure may vary depending on your medical conditions, your age, and other factors. For most people, a normal blood pressure is less than 120/80.  Hypertension is managed by lifestyle changes, medicines, or both. Lifestyle changes include weight loss, eating a healthy, low-sodium diet, exercising more, and limiting alcohol. This information is not intended to replace advice given to you by your health care provider. Make sure you discuss any questions you have with your health care provider. Document Released: 10/13/2011 Document Revised: 12/17/2015 Document Reviewed: 12/17/2015 Elsevier Interactive Patient Education  2018 Elsevier Inc.  How to Take Your Blood Pressure You can take your blood pressure at home with a machine. You may need to check your blood pressure at home:  To check if you have high blood pressure (hypertension).  To check your blood pressure over time.  To make sure your blood pressure medicine is working.  Supplies needed: You will need a blood pressure machine, or monitor. You can buy one at a drugstore or online. When choosing one:  Choose one with an arm cuff.  Choose one that wraps around your upper arm. Only one   finger should fit between your arm and the cuff.  Do not choose one that measures your blood pressure from your wrist or finger.  Your doctor can suggest a monitor. How to prepare Avoid these things for 30 minutes before checking your blood pressure:  Drinking caffeine.  Drinking alcohol.  Eating.  Smoking.  Exercising.  Five minutes before checking your blood pressure:  Pee.  Sit in a dining chair. Avoid sitting in a soft couch or armchair.  Be quiet. Do not talk.  How to take your blood pressure Follow the instructions that came with your machine. If you have a digital blood pressure  monitor, these may be the instructions: 1. Sit up straight. 2. Place your feet on the floor. Do not cross your ankles or legs. 3. Rest your left arm at the level of your heart. You may rest it on a table, desk, or chair. 4. Pull up your shirt sleeve. 5. Wrap the blood pressure cuff around the upper part of your left arm. The cuff should be 1 inch (2.5 cm) above your elbow. It is best to wrap the cuff around bare skin. 6. Fit the cuff snugly around your arm. You should be able to place only one finger between the cuff and your arm. 7. Put the cord inside the groove of your elbow. 8. Press the power button. 9. Sit quietly while the cuff fills with air and loses air. 10. Write down the numbers on the screen. 11. Wait 2-3 minutes and then repeat steps 1-10.  What do the numbers mean? Two numbers make up your blood pressure. The first number is called systolic pressure. The second is called diastolic pressure. An example of a blood pressure reading is "120 over 80" (or 120/80). If you are an adult and do not have a medical condition, use this guide to find out if your blood pressure is normal: Normal  First number: below 120.  Second number: below 80. Elevated  First number: 120-129.  Second number: below 80. Hypertension stage 1  First number: 130-139.  Second number: 80-89. Hypertension stage 2  First number: 140 or above.  Second number: 90 or above. Your blood pressure is above normal even if only the top or bottom number is above normal. Follow these instructions at home:  Check your blood pressure as often as your doctor tells you to.  Take your monitor to your next doctor's appointment. Your doctor will: ? Make sure you are using it correctly. ? Make sure it is working right.  Make sure you understand what your blood pressure numbers should be.  Tell your doctor if your medicines are causing side effects. Contact a doctor if:  Your blood pressure keeps being  high. Get help right away if:  Your first blood pressure number is higher than 180.  Your second blood pressure number is higher than 120. This information is not intended to replace advice given to you by your health care provider. Make sure you discuss any questions you have with your health care provider. Document Released: 01/01/2008 Document Revised: 12/17/2015 Document Reviewed: 06/27/2015 Elsevier Interactive Patient Education  2018 Elsevier Inc.  

## 2017-12-21 NOTE — Progress Notes (Signed)
Subjective:    Patient ID: Garrett Anderson, male    DOB: 1975-09-27, 42 y.o.   MRN: [id number]  No chief complaint on file.   HPI Patient was seen today for follow-up on chronic conditions and TOC, previously seen by Dr. Tawanna Coolerodd.  HTN: -restarted 3 days ago lisinopril-hydrochlorothiazide -off meds x 5 months. -stopped eating meat.  Endorses colonoscopy at age 42 or [address] with Dr. Elnoria HowardHung? [place]?   History reviewed. No pertinent past medical history.  No Known Allergies  ROS General: Denies fever, chills, night sweats, changes in weight, changes in appetite HEENT: Denies headaches, ear pain, changes in vision, rhinorrhea, sore throat CV: Denies CP, palpitations, SOB, orthopnea Pulm: Denies SOB, cough, wheezing GI: Denies abdominal pain, nausea, vomiting, diarrhea, constipation GU: Denies dysuria, hematuria, frequency, vaginal discharge Msk: Denies muscle cramps, joint pains Neuro: Denies weakness, numbness, tingling Skin: Denies rashes, bruising Psych: Denies depression, anxiety, hallucinations    Objective:    Blood pressure 120/78, pulse 69, temperature 98 F (36.7 C), temperature source Oral, height 5\' 8"  (1.727 m), weight 211 lb (95.7 kg), SpO2 98 %.  Gen. Pleasant, well-nourished, in no distress, normal affect   HEENT: [place]/AT, face symmetric, no scleral icterus, PERRLA, nares patent without drainage, pharynx without erythema or exudate. Lungs: no accessory muscle use, CTAB, no wheezes or rales Cardiovascular: RRR, no m/r/g, no peripheral edema Neuro:  A&Ox3, CN II-XII intact, normal gait  Wt Readings from Last 3 Encounters:  12/21/17 211 lb (95.7 kg)  06/06/17 205 lb (93 kg)  01/04/17 207 lb 3.2 oz (94 kg)    Lab Results  Component Value Date   WBC 4.7 11/03/2015   HGB 14.8 11/03/2015   HCT 43.5 11/03/2015   PLT 210.0 11/03/2015   GLUCOSE 88 11/03/2015   CHOL 168 11/03/2015   TRIG 51.0 11/03/2015   HDL 45.80 11/03/2015   LDLCALC 112 (H) 11/03/2015   ALT 16 11/03/2015   AST 15 11/03/2015   NA 140 11/03/2015   K 4.1 11/03/2015   CL 104 11/03/2015   CREATININE 1.01 11/03/2015   BUN 8 11/03/2015   CO2 30 11/03/2015   TSH 0.91 11/03/2015   PSA 0.42 11/03/2015    Assessment/Plan:  Essential hypertension  -controlled -continue lisinopril-HCTZ 10-12.5 mg daily -encouraged to continue lifestyle modifications -pt to check bp at home  F/u prn in the next 3 months, sooner if needed  Abbe AmsterdamShannon Banks, MD

## 2017-12-24 ENCOUNTER — Encounter: Payer: Self-pay | Admitting: Family Medicine

## 2018-02-17 ENCOUNTER — Encounter: Payer: Self-pay | Admitting: Family Medicine

## 2018-02-17 ENCOUNTER — Ambulatory Visit: Payer: 59 | Admitting: Family Medicine

## 2018-02-17 VITALS — BP 126/60 | HR 76 | Temp 98.1°F | Wt 214.0 lb

## 2018-02-17 DIAGNOSIS — R109 Unspecified abdominal pain: Secondary | ICD-10-CM | POA: Diagnosis not present

## 2018-02-17 DIAGNOSIS — R110 Nausea: Secondary | ICD-10-CM

## 2018-02-17 LAB — POCT URINALYSIS DIPSTICK
Bilirubin, UA: NEGATIVE
Blood, UA: NEGATIVE
Glucose, UA: NEGATIVE
KETONES UA: NEGATIVE
Leukocytes, UA: NEGATIVE
Nitrite, UA: NEGATIVE
Protein, UA: NEGATIVE
Spec Grav, UA: 1.015 (ref 1.010–1.025)
Urobilinogen, UA: 0.2 E.U./dL
pH, UA: 6 (ref 5.0–8.0)

## 2018-02-17 LAB — CBC WITH DIFFERENTIAL/PLATELET
BASOS PCT: 0.7 % (ref 0.0–3.0)
Basophils Absolute: 0 10*3/uL (ref 0.0–0.1)
EOS PCT: 4.6 % (ref 0.0–5.0)
Eosinophils Absolute: 0.2 10*3/uL (ref 0.0–0.7)
HCT: 42.8 % (ref 39.0–52.0)
HEMOGLOBIN: 14.3 g/dL (ref 13.0–17.0)
LYMPHS ABS: 1.6 10*3/uL (ref 0.7–4.0)
Lymphocytes Relative: 33.6 % (ref 12.0–46.0)
MCHC: 33.4 g/dL (ref 30.0–36.0)
MCV: 88.1 fl (ref 78.0–100.0)
MONO ABS: 0.6 10*3/uL (ref 0.1–1.0)
MONOS PCT: 11.9 % (ref 3.0–12.0)
Neutro Abs: 2.4 10*3/uL (ref 1.4–7.7)
Neutrophils Relative %: 49.2 % (ref 43.0–77.0)
Platelets: 208 10*3/uL (ref 150.0–400.0)
RBC: 4.86 Mil/uL (ref 4.22–5.81)
RDW: 13.5 % (ref 11.5–15.5)
WBC: 4.8 10*3/uL (ref 4.0–10.5)

## 2018-02-17 LAB — COMPREHENSIVE METABOLIC PANEL
ALT: 18 U/L (ref 0–53)
AST: 17 U/L (ref 0–37)
Albumin: 4.1 g/dL (ref 3.5–5.2)
Alkaline Phosphatase: 56 U/L (ref 39–117)
BUN: 9 mg/dL (ref 6–23)
CO2: 30 mEq/L (ref 19–32)
Calcium: 9.2 mg/dL (ref 8.4–10.5)
Chloride: 100 mEq/L (ref 96–112)
Creatinine, Ser: 1.01 mg/dL (ref 0.40–1.50)
GFR: 97.82 mL/min (ref >60.00)
Glucose, Bld: 82 mg/dL (ref 70–99)
Potassium: 4.1 mEq/L (ref 3.5–5.1)
Sodium: 137 mEq/L (ref 135–145)
Total Bilirubin: 0.3 mg/dL (ref 0.2–1.2)
Total Protein: 7 g/dL (ref 6.0–8.3)

## 2018-02-17 LAB — LIPASE: Lipase: 38 U/L (ref 11.0–59.0)

## 2018-02-17 NOTE — Progress Notes (Signed)
Subjective:    Patient ID: Garrett Anderson, male    DOB: [birth year], 43 y.o.   MRN: [id number]  No chief complaint on file.   HPI Patient was seen today for acute concern.  Pt endorses discomfort/burning in bilateral lower back that occasionally moves around to her right lower quadrant.  Feeling noted as a "dull discomfort" that patient notices more at night.  Feeling has been happening for months.  Not taking anything for the feeling.  Pt now with nausea associated with the pain.  Pt denies injury, endorses heavy lifting a few weeks ago.  Pt drinking at least 64 ounces of water per day.  Pt denies hematuria, fever, chills, new medications, emesis, weakness, constipation, a/w food.  Pt expresses concerns about cancer as his mother, a non smoker had lung cancer.  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  History reviewed. No pertinent past medical history.  No Known Allergies  ROS General: Denies fever, chills, night sweats, changes in weight, changes in appetite HEENT: Denies headaches, ear pain, changes in vision, rhinorrhea, sore throat CV: Denies CP, palpitations, SOB, orthopnea Pulm: Denies SOB, cough, wheezing GI: Denies abdominal pain, nausea, vomiting, diarrhea, constipation GU: Denies dysuria, hematuria, frequency, vaginal discharge Msk: Denies muscle cramps, joint pains  +back and R flank pain. Neuro: Denies weakness, numbness, tingling Skin: Denies rashes, bruising Psych: Denies depression, anxiety, hallucinations      Objective:    Blood pressure 126/60, pulse 76, temperature 98.1 F (36.7 C), temperature source Oral, weight 214 lb (97.1 kg), SpO2 98 %.  Gen. Pleasant, well-nourished, in no distress, normal affect   HEENT: [place]/AT, face symmetric, no scleral icterus, PERRLA, nares patent without drainage Lungs: no accessory muscle use, CTAB, no wheezes or  rales Cardiovascular: RRR, no m/r/g, no peripheral edema Abdomen: BS present, soft, NT/ND, no hepatosplenomegaly.  No CVA tenderness. Musculoskeletal: No TTP of cervical, thoracic, or lumbar spine.  No TTP paraspinal muscles or flank.  No deformities, no cyanosis or clubbing, normal tone Neuro:  A&Ox3, CN II-XII intact, normal gait Skin:  Warm, no lesions/ rash   Wt Readings from Last 3 Encounters:  02/17/18 214 lb (97.1 kg)  12/21/17 211 lb (95.7 kg)  06/06/17 205 lb (93 kg)    Lab Results  Component Value Date   WBC 4.7 11/03/2015   HGB 14.8 11/03/2015   HCT 43.5 11/03/2015   PLT 210.0 11/03/2015   GLUCOSE 88 11/03/2015   CHOL 168 11/03/2015   TRIG 51.0 11/03/2015   HDL 45.80 11/03/2015   LDLCALC 112 (H) 11/03/2015   ALT 16 11/03/2015   AST 15 11/03/2015   NA 140 11/03/2015   K 4.1 11/03/2015   CL 104 11/03/2015   CREATININE 1.01 11/03/2015   BUN 8 11/03/2015   CO2 30 11/03/2015   TSH 0.91 11/03/2015   PSA 0.42 11/03/2015    Assessment/Plan:  Flank pain -Discussed various causes including renal calculi, muscle strain, UTI, Pyelonephritis, cholecystitis, liver disease, constipation, appendicitis, pancreatitis, lung infections, etc. -UA negative.  No RBCs -We will obtain labs -Depending on labs will obtain imaging if needed -Patient encouraged to stay hydrated, stretch, use good body mechanics when lifting -Given handout -Patient given RTC or ED precautions - Plan: CBC with Differential/Platelet, Comprehensive metabolic panel, POCT urinalysis dipstick  Nausea  -dicussed possible causes including liver/gallbladder d/o, constipation, renal calculi -will obtain labs  - Plan: Comprehensive metabolic panel, Lipase  Follow-up PRN  Abbe Amsterdam, MD

## 2018-02-17 NOTE — Patient Instructions (Signed)
Flank Pain, Adult  Flank pain is pain that is located on the side of the body between the upper abdomen and the back. This area is called the flank. The pain may occur over a short period of time (acute), or it may be long-term or recurring (chronic). It may be mild or severe. Flank pain can be caused by many things, including:  · Muscle soreness or injury.  · Kidney stones or kidney disease.  · Stress.  · A disease of the spine (vertebral disk disease).  · A lung infection (pneumonia).  · Fluid around the lungs (pulmonary edema).  · A skin rash caused by the chickenpox virus (shingles).  · Tumors that affect the back of the abdomen.  · Gallbladder disease.  Follow these instructions at home:    · Drink enough fluid to keep your urine clear or pale yellow.  · Rest as told by your health care provider.  · Take over-the-counter and prescription medicines only as told by your health care provider.  · Keep a journal to track what has caused your flank pain and what has made it feel better.  · Keep all follow-up visits as told by your health care provider. This is important.  Contact a health care provider if:  · Your pain is not controlled with medicine.  · You have new symptoms.  · Your pain gets worse.  · You have a fever.  · Your symptoms last longer than 2-3 days.  · You have trouble urinating or you are urinating very frequently.  Get help right away if:  · You have trouble breathing or you are short of breath.  · Your abdomen hurts or it is swollen or red.  · You have nausea or vomiting.  · You feel faint or you pass out.  · You have blood in your urine.  Summary  · Flank pain is pain that is located on the side of the body between the upper abdomen and the back.  · The pain may occur over a short period of time (acute), or it may be long-term or recurring (chronic). It may be mild or severe.  · Flank pain can be caused by many things.  · Contact your health care provider if your symptoms get worse or they last  longer than 2-3 days.  This information is not intended to replace advice given to you by your health care provider. Make sure you discuss any questions you have with your health care provider.  Document Released: 03/11/2005 Document Revised: 04/02/2016 Document Reviewed: 04/02/2016  Elsevier Interactive Patient Education © 2019 Elsevier Inc.

## 2018-09-19 ENCOUNTER — Encounter: Payer: Self-pay | Admitting: Family Medicine

## 2018-09-19 ENCOUNTER — Ambulatory Visit: Payer: Self-pay | Admitting: *Deleted

## 2018-09-19 NOTE — Telephone Encounter (Signed)
Would have pt restart bp med and f/u in clinic this wk.

## 2018-09-19 NOTE — Telephone Encounter (Signed)
Pt advised to go to [facility] per [facility].

## 2018-09-19 NOTE — Telephone Encounter (Signed)
Noted  

## 2018-09-19 NOTE — Telephone Encounter (Signed)
  Pt called with having an elevated b/p today. He has not taking any b/p medication for a month. His b/p this morning was 160/110 and asked to repeat it. It was 180/120 in one arm and 170/110 other arm with heart rated of 76.  He checked his b/p because he had been having a headache. He denies shortness of breath, weakness, chest pain or blurred vision. His medication list shows lisinopril-hydrochlorothiazide 10-12.5 mg tab, to take 1/2 tab daily as needed. He stated his b/p had been down, then he has had a lot of stressors in his life, such as losing his father. Notified LB at [facility] regarding appointment or urgent care.  Per protocol he needs to be seen at the practice or urgent care in 4 hours. He is advised to go to an urgent care now. He is advised to go to the Ed if he develops the other cardiac or neurological symptoms. Also to check his b/p daily.  He voiced understanding. Routing to LB at [place] for review.  Reason for Disposition . [4] Systolic BP  >= 403 OR Diastolic >= 474  AND [2] having NO cardiac or neurologic symptoms  Answer Assessment - Initial Assessment Questions 1. BLOOD PRESSURE: "What is the blood pressure?" "Did you take at least two measurements 5 minutes apart?"     160/110  Taken about 20 mins ago and  2. ONSET: "When did you take your blood pressure?"     Today at work 3. HOW: "How did you obtain the blood pressure?" (e.g., visiting nurse, automatic home BP monitor)   B/P checked at the fire department 4. HISTORY: "Do you have a history of high blood pressure?"     Yes 5. MEDICATIONS: "Are you taking any medications for blood pressure?" "Have you missed any doses recently?"     Yes and have missed taking his medication for one month 6. OTHER SYMPTOMS: "Do you have any symptoms?" (e.g., headache, chest pain, blurred vision, difficulty breathing, weakness)     headache 7. PREGNANCY: "Is there any chance you are pregnant?" "When was your last menstrual  period?"    n/a  Protocols used: HIGH BLOOD PRESSURE-A-AH

## 2018-09-20 NOTE — Telephone Encounter (Signed)
Spoke with patient. Informed him per Dr. Volanda Napoleon he needs to restart BP medication and f/u in clinic this week. Patient reports he will be out of town this week. Patient scheduled to f/u on 09/28/2018

## 2018-09-28 ENCOUNTER — Encounter: Payer: Self-pay | Admitting: Family Medicine

## 2018-09-28 ENCOUNTER — Ambulatory Visit: Payer: 59 | Admitting: Family Medicine

## 2018-09-28 ENCOUNTER — Other Ambulatory Visit: Payer: Self-pay

## 2018-09-28 VITALS — BP 128/82 | HR 86 | Temp 97.0°F | Wt 215.0 lb

## 2018-09-28 DIAGNOSIS — G44009 Cluster headache syndrome, unspecified, not intractable: Secondary | ICD-10-CM | POA: Diagnosis not present

## 2018-09-28 DIAGNOSIS — I10 Essential (primary) hypertension: Secondary | ICD-10-CM

## 2018-09-28 LAB — BASIC METABOLIC PANEL
BUN: 19 mg/dL (ref 6–23)
CO2: 29 mEq/L (ref 19–32)
Calcium: 9.2 mg/dL (ref 8.4–10.5)
Chloride: 102 mEq/L (ref 96–112)
Creatinine, Ser: 1.11 mg/dL (ref 0.40–1.50)
GFR: 87.47 mL/min (ref >60.00)
Glucose, Bld: 89 mg/dL (ref 70–99)
Potassium: 3.9 mEq/L (ref 3.5–5.1)
Sodium: 138 mEq/L (ref 135–145)

## 2018-09-28 MED ORDER — LISINOPRIL-HYDROCHLOROTHIAZIDE 10-12.5 MG PO TABS: ORAL_TABLET | ORAL | 3 refills | Status: DC

## 2018-09-28 NOTE — Patient Instructions (Signed)
Managing Your Hypertension Hypertension is commonly called high blood pressure. This is when the force of your blood pressing against the walls of your arteries is too strong. Arteries are blood vessels that carry blood from your heart throughout your body. Hypertension forces the heart to work harder to pump blood, and may cause the arteries to become narrow or stiff. Having untreated or uncontrolled hypertension can cause heart attack, stroke, kidney disease, and other problems. What are blood pressure readings? A blood pressure reading consists of a higher number over a lower number. Ideally, your blood pressure should be below 120/80. The first ("top") number is called the systolic pressure. It is a measure of the pressure in your arteries as your heart beats. The second ("bottom") number is called the diastolic pressure. It is a measure of the pressure in your arteries as the heart relaxes. What does my blood pressure reading mean? Blood pressure is classified into four stages. Based on your blood pressure reading, your health care provider may use the following stages to determine what type of treatment you need, if any. Systolic pressure and diastolic pressure are measured in a unit called mm Hg. Normal  Systolic pressure: below 784.  Diastolic pressure: below 80. Elevated  Systolic pressure: 696-295.  Diastolic pressure: below 80. Hypertension stage 1  Systolic pressure: 284-132.  Diastolic pressure: 44-01. Hypertension stage 2  Systolic pressure: 027 or above.  Diastolic pressure: 90 or above. What health risks are associated with hypertension? Managing your hypertension is an important responsibility. Uncontrolled hypertension can lead to:  A heart attack.  A stroke.  A weakened blood vessel (aneurysm).  Heart failure.  Kidney damage.  Eye damage.  Metabolic syndrome.  Memory and concentration problems. What changes can I make to manage my hypertension?  Hypertension can be managed by making lifestyle changes and possibly by taking medicines. Your health care provider will help you make a plan to bring your blood pressure within a normal range. Eating and drinking   Eat a diet that is high in fiber and potassium, and low in salt (sodium), added sugar, and fat. An example eating plan is called the DASH (Dietary Approaches to Stop Hypertension) diet. To eat this way: ? Eat plenty of fresh fruits and vegetables. Try to fill half of your plate at each meal with fruits and vegetables. ? Eat whole grains, such as whole wheat pasta, brown rice, or whole grain bread. Fill about one quarter of your plate with whole grains. ? Eat low-fat diary products. ? Avoid fatty cuts of meat, processed or cured meats, and poultry with skin. Fill about one quarter of your plate with lean proteins such as fish, chicken without skin, beans, eggs, and tofu. ? Avoid premade and processed foods. These tend to be higher in sodium, added sugar, and fat.  Reduce your daily sodium intake. Most people with hypertension should eat less than 1,500 mg of sodium a day.  Limit alcohol intake to no more than 1 drink a day for nonpregnant women and 2 drinks a day for men. One drink equals 12 oz of beer, 5 oz of wine, or 1 oz of hard liquor. Lifestyle  Work with your health care provider to maintain a healthy body weight, or to lose weight. Ask what an ideal weight is for you.  Get at least 30 minutes of exercise that causes your heart to beat faster (aerobic exercise) most days of the week. Activities may include walking, swimming, or biking.  Include exercise  to strengthen your muscles (resistance exercise), such as weight lifting, as part of your weekly exercise routine. Try to do these types of exercises for 30 minutes at least 3 days a week.  Do not use any products that contain nicotine or tobacco, such as cigarettes and e-cigarettes. If you need help quitting, ask your health  care provider.  Control any long-term (chronic) conditions you have, such as high cholesterol or diabetes. Monitoring  Monitor your blood pressure at home as told by your health care provider. Your personal target blood pressure may vary depending on your medical conditions, your age, and other factors.  Have your blood pressure checked regularly, as often as told by your health care provider. Working with your health care provider  Review all the medicines you take with your health care provider because there may be side effects or interactions.  Talk with your health care provider about your diet, exercise habits, and other lifestyle factors that may be contributing to hypertension.  Visit your health care provider regularly. Your health care provider can help you create and adjust your plan for managing hypertension. Will I need medicine to control my blood pressure? Your health care provider may prescribe medicine if lifestyle changes are not enough to get your blood pressure under control, and if:  Your systolic blood pressure is 130 or higher.  Your diastolic blood pressure is 80 or higher. Take medicines only as told by your health care provider. Follow the directions carefully. Blood pressure medicines must be taken as prescribed. The medicine does not work as well when you skip doses. Skipping doses also puts you at risk for problems. Contact a health care provider if:  You think you are having a reaction to medicines you have taken.  You have repeated (recurrent) headaches.  You feel dizzy.  You have swelling in your ankles.  You have trouble with your vision. Get help right away if:  You develop a severe headache or confusion.  You have unusual weakness or numbness, or you feel faint.  You have severe pain in your chest or abdomen.  You vomit repeatedly.  You have trouble breathing. Summary  Hypertension is when the force of blood pumping through your arteries  is too strong. If this condition is not controlled, it may put you at risk for serious complications.  Your personal target blood pressure may vary depending on your medical conditions, your age, and other factors. For most people, a normal blood pressure is less than 120/80.  Hypertension is managed by lifestyle changes, medicines, or both. Lifestyle changes include weight loss, eating a healthy, low-sodium diet, exercising more, and limiting alcohol. This information is not intended to replace advice given to you by your health care provider. Make sure you discuss any questions you have with your health care provider. Document Released: 10/13/2011 Document Revised: 05/12/2018 Document Reviewed: 12/17/2015 Elsevier Patient Education  [employer].  Cluster Headache A cluster headache is a type of headache that causes deep, intense head pain. Cluster headaches can last from 15 minutes to 3 hours. They usually occur:  On one side of the head. They may occur on the other side when a new cluster of headaches begins.  Repeatedly over weeks to months.  Several times a day.  At the same time of day, often at night.  More often in the fall and springtime. What are the causes? The cause of this condition is not known. What increases the risk? This condition is more  likely to develop in:  Males.  People who drink alcohol.  People who smoke or use products that contain nicotine or tobacco.  People who take medicines that cause blood vessels to expand, such as nitroglycerin.  People who take antihistamines. What are the signs or symptoms? Symptoms of this condition include:  Severe pain on one side of the head that begins behind or around your eye or temple.  Pain on one side of the head.  Nausea.  Sensitivity to light.  Runny nose and nasal stuffiness.  Sweaty, pale skin on the face.  Droopy or swollen eyelid, eye redness, or tearing.  Restlessness and agitation. How  is this diagnosed? This condition may be diagnosed based on:  Your symptoms.  A physical exam. Your health care provider may order tests to see if your headaches are caused by another medical condition. These tests may show that you do not have cluster headaches. Tests may include:  A CT scan of your head.  An MRI of your head.  Lab tests. How is this treated? This condition may be treated with:  Medicines to relieve pain and to prevent repeated (recurrent) attacks. Some people may need a combination of medicines.  Oxygen. This helps to relieve pain. Follow these instructions at home: Headache diary Keep a headache diary as told by your health care provider. Doing this can help you and your health care provider figure out what triggers your headaches. In your headache diary, include information about:  The time of day that your headache started and what you were doing when it began.  How long your headache lasted.  Where your pain started and whether it moved to other areas.  The type of pain, such as burning, stabbing, throbbing, or cramping.  Your level of pain. Use a pain scale and rate the pain with a number from 1 (mild) up to 10 (severe).  The treatment that you used, and any change in symptoms after treatment.  Medicines  Take over-the-counter and prescription medicines only as told by your health care provider.  Do not drive or use heavy machinery while taking prescription pain medicine.  Use oxygen as told by your health care provider. Lifestyle  Follow a regular sleep schedule. Do not vary the time that you go to bed or the amount that you sleep from day to day. It is important to stay on the same schedule during a cluster period to help prevent headaches.  Exercise regularly.  Eat a healthy diet and avoid foods that may trigger your headaches.  Avoid alcohol.  Do not use any products that contain nicotine or tobacco, such as cigarettes and e-cigarettes.  If you need help quitting, ask your health care provider. Contact a health care provider if:  Your headaches change, become more severe, or occur more often.  The medicine or oxygen that your health care provider recommended does not help. Get help right away if:  You faint.  You have weakness or numbness, especially on one side of your body or face.  You have double vision.  You have nausea or vomiting that does not go away within several hours.  You have trouble talking, walking, or keeping your balance.  You have pain or stiffness in your neck.  You have a fever. Summary  A cluster headache is a type of headache that causes deep, intense head pain, usually on one side of the head.  Keep a headache diary to help discover what triggers your headaches.  A regular sleep schedule can help prevent headaches. This information is not intended to replace advice given to you by your health care provider. Make sure you discuss any questions you have with your health care provider. Document Released: 01/18/2005 Document Revised: 02/02/2017 Document Reviewed: 09/30/2015 Elsevier Patient Education  2020 [employer].  How to Take Your Blood Pressure You can take your blood pressure at home with a machine. You may need to check your blood pressure at home:  To check if you have high blood pressure (hypertension).  To check your blood pressure over time.  To make sure your blood pressure medicine is working. Supplies needed: You will need a blood pressure machine, or monitor. You can buy one at a drugstore or online. When choosing one:  Choose one with an arm cuff.  Choose one that wraps around your upper arm. Only one finger should fit between your arm and the cuff.  Do not choose one that measures your blood pressure from your wrist or finger. Your doctor can suggest a monitor. How to prepare Avoid these things for 30 minutes before checking your blood pressure:  Drinking  caffeine.  Drinking alcohol.  Eating.  Smoking.  Exercising. Five minutes before checking your blood pressure:  Pee.  Sit in a dining chair. Avoid sitting in a soft couch or armchair.  Be quiet. Do not talk. How to take your blood pressure Follow the instructions that came with your machine. If you have a digital blood pressure monitor, these may be the instructions: 1. Sit up straight. 2. Place your feet on the floor. Do not cross your ankles or legs. 3. Rest your left arm at the level of your heart. You may rest it on a table, desk, or chair. 4. Pull up your shirt sleeve. 5. Wrap the blood pressure cuff around the upper part of your left arm. The cuff should be 1 inch (2.5 cm) above your elbow. It is best to wrap the cuff around bare skin. 6. Fit the cuff snugly around your arm. You should be able to place only one finger between the cuff and your arm. 7. Put the cord inside the groove of your elbow. 8. Press the power button. 9. Sit quietly while the cuff fills with air and loses air. 10. Write down the numbers on the screen. 11. Wait 2-3 minutes and then repeat steps 1-10. What do the numbers mean? Two numbers make up your blood pressure. The first number is called systolic pressure. The second is called diastolic pressure. An example of a blood pressure reading is "120 over 80" (or 120/80). If you are an adult and do not have a medical condition, use this guide to find out if your blood pressure is normal: Normal  First number: below 120.  Second number: below 80. Elevated  First number: 120-129.  Second number: below 80. Hypertension stage 1  First number: 130-139.  Second number: 80-89. Hypertension stage 2  First number: 140 or above.  Second number: 90 or above. Your blood pressure is above normal even if only the top or bottom number is above normal. Follow these instructions at home:  Check your blood pressure as often as your doctor tells you to.   Take your monitor to your next doctor's appointment. Your doctor will: ? Make sure you are using it correctly. ? Make sure it is working right.  Make sure you understand what your blood pressure numbers should be.  Tell your doctor if your  medicines are causing side effects. Contact a doctor if:  Your blood pressure keeps being high. Get help right away if:  Your first blood pressure number is higher than 180.  Your second blood pressure number is higher than 120. This information is not intended to replace advice given to you by your health care provider. Make sure you discuss any questions you have with your health care provider. Document Released: 01/01/2008 Document Revised: 12/31/2016 Document Reviewed: 06/27/2015 Elsevier Patient Education  2020 [employer]

## 2018-09-28 NOTE — Progress Notes (Signed)
Subjective:    Patient ID: [place], male    DOB: 06/24/1975, 43 y.o.   MRN: [id number]  No chief complaint on file.   HPI Patient was seen today for follow-up on blood pressure.  [place] endorses not taking BP med times several months.  Recently started after blood pressure was 160/110 while at work.  At the time pt endorsed headaches.  Pt with history of cluster migraines.  Typically does not take anything for his headaches.  Trying to drink more water and get more rest.  Pt has never taken anything for his migraines.  Denies changes in vision or chest pain.  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 Exercising regularly.  Has increased intake of coffee/caffeine since March when COVID started.  History reviewed. No pertinent past medical history.  No Known Allergies  ROS General: Denies fever, chills, night sweats, changes in weight, changes in appetite HEENT: Denies ear pain, changes in vision, rhinorrhea, sore throat  +HAs CV: Denies CP, palpitations, SOB, orthopnea Pulm: Denies SOB, cough, wheezing GI: Denies abdominal pain, nausea, vomiting, diarrhea, constipation GU: Denies dysuria, hematuria, frequency, vaginal discharge Msk: Denies muscle cramps, joint pains Neuro: Denies weakness, numbness, tingling Skin: Denies rashes, bruising Psych: Denies depression, anxiety, hallucinations      Objective:    Blood pressure 128/82, pulse 86, temperature (!) 97 F (36.1 C), temperature source Temporal, weight 215 lb (97.5 kg), SpO2 97 %.  Gen. Pleasant, well-nourished, in no distress, normal affect   HEENT: [place], face symmetric, conjunctiva clear, no scleral icterus, PERRLA, EOMI, nares patent without drainage Lungs: no accessory muscle use, CTAB, no wheezes or rales Cardiovascular: RRR, no m/r/g, no peripheral edema Neuro:  A&Ox3, CN II-XII intact, normal gait Skin:  Warm, no lesions/ rash   Wt Readings from Last 3 Encounters:  09/28/18 215 lb (97.5 kg)   02/17/18 214 lb (97.1 kg)  12/21/17 211 lb (95.7 kg)    Lab Results  Component Value Date   WBC 4.8 02/17/2018   HGB 14.3 02/17/2018   HCT 42.8 02/17/2018   PLT 208.0 02/17/2018   GLUCOSE 82 02/17/2018   CHOL 168 11/03/2015   TRIG 51.0 11/03/2015   HDL 45.80 11/03/2015   LDLCALC 112 (H) 11/03/2015   ALT 18 02/17/2018   AST 17 02/17/2018   NA 137 02/17/2018   K 4.1 02/17/2018   CL 100 02/17/2018   CREATININE 1.01 02/17/2018   BUN 9 02/17/2018   CO2 30 02/17/2018   TSH 0.91 11/03/2015   PSA 0.42 11/03/2015    Assessment/Plan:  Essential hypertension  -controlled -continue lifestyle modifications -continue to monitor bp.  Notify clinic if consistently >140/90 - Plan: Basic metabolic panel, lisinopril-hydrochlorothiazide (ZESTORETIC) 10-12.5 MG tablet  Migraine-cluster headache syndrome -discussed HA prevention -offered abortive medication.  Pt declines at this time.  Will consider OTC excedrin migraine prn.  Pt to contact clinic if interested in mediation -given handout  Declined influenza vaccine.  F/u 1 month  [place] Mitt[place], MD

## 2019-09-24 ENCOUNTER — Encounter: Payer: Self-pay | Admitting: Family Medicine

## 2019-09-24 ENCOUNTER — Other Ambulatory Visit: Payer: Self-pay

## 2019-09-24 ENCOUNTER — Ambulatory Visit (INDEPENDENT_AMBULATORY_CARE_PROVIDER_SITE_OTHER): Payer: 59 | Admitting: Family Medicine

## 2019-09-24 ENCOUNTER — Ambulatory Visit (INDEPENDENT_AMBULATORY_CARE_PROVIDER_SITE_OTHER)
Admission: RE | Admit: 2019-09-24 | Discharge: 2019-09-24 | Disposition: A | Discharge status: Home / Self Care | Payer: 59 | Source: Ambulatory Visit | Attending: Family Medicine | Admitting: Family Medicine

## 2019-09-24 VITALS — BP 118/78 | HR 81 | Temp 98.6°F | Ht 68.0 in | Wt 209.0 lb

## 2019-09-24 DIAGNOSIS — R0781 Pleurodynia: Secondary | ICD-10-CM

## 2019-09-24 DIAGNOSIS — I10 Essential (primary) hypertension: Secondary | ICD-10-CM

## 2019-09-24 DIAGNOSIS — Z0000 Encounter for general adult medical examination without abnormal findings: Secondary | ICD-10-CM | POA: Diagnosis not present

## 2019-09-24 DIAGNOSIS — Z1211 Encounter for screening for malignant neoplasm of colon: Secondary | ICD-10-CM | POA: Diagnosis not present

## 2019-09-24 DIAGNOSIS — R351 Nocturia: Secondary | ICD-10-CM

## 2019-09-24 DIAGNOSIS — Z13220 Encounter for screening for lipoid disorders: Secondary | ICD-10-CM

## 2019-09-24 NOTE — Patient Instructions (Signed)
Preventive Care 41-44 Years Old, Male Preventive care refers to lifestyle choices and visits with your health care provider that can promote health and wellness. This includes:  A yearly physical exam. This is also called an annual well check.  Regular dental and eye exams.  Immunizations.  Screening for certain conditions.  Healthy lifestyle choices, such as eating a healthy diet, getting regular exercise, not using drugs or products that contain nicotine and tobacco, and limiting alcohol use. What can I expect for my preventive care visit? Physical exam Your health care provider will check:  Height and weight. These may be used to calculate body mass index (BMI), which is a measurement that tells if you are at a healthy weight.  Heart rate and blood pressure.  Your skin for abnormal spots. Counseling Your health care provider may ask you questions about:  Alcohol, tobacco, and drug use.  Emotional well-being.  Home and relationship well-being.  Sexual activity.  Eating habits.  Work and work Statistician. What immunizations do I need?  Influenza (flu) vaccine  This is recommended every year. Tetanus, diphtheria, and pertussis (Tdap) vaccine  You may need a Td booster every 10 years. Varicella (chickenpox) vaccine  You may need this vaccine if you have not already been vaccinated. Zoster (shingles) vaccine  You may need this after age 64. Measles, mumps, and rubella (MMR) vaccine  You may need at least one dose of MMR if you were born in 1957 or later. You may also need a second dose. Pneumococcal conjugate (PCV13) vaccine  You may need this if you have certain conditions and were not previously vaccinated. Pneumococcal polysaccharide (PPSV23) vaccine  You may need one or two doses if you smoke cigarettes or if you have certain conditions. Meningococcal conjugate (MenACWY) vaccine  You may need this if you have certain conditions. Hepatitis A  vaccine  You may need this if you have certain conditions or if you travel or work in places where you may be exposed to hepatitis A. Hepatitis B vaccine  You may need this if you have certain conditions or if you travel or work in places where you may be exposed to hepatitis B. Haemophilus influenzae type b (Hib) vaccine  You may need this if you have certain risk factors. Human papillomavirus (HPV) vaccine  If recommended by your health care provider, you may need three doses over 6 months. You may receive vaccines as individual doses or as more than one vaccine together in one shot (combination vaccines). Talk with your health care provider about the risks and benefits of combination vaccines. What tests do I need? Blood tests  Lipid and cholesterol levels. These may be checked every 5 years, or more frequently if you are over 60 years old.  Hepatitis C test.  Hepatitis B test. Screening  Lung cancer screening. You may have this screening every year starting at age 43 if you have a 30-pack-year history of smoking and currently smoke or have quit within the past 15 years.  Prostate cancer screening. Recommendations will vary depending on your family history and other risks.  Colorectal cancer screening. All adults should have this screening starting at age 72 and continuing until age 2. Your health care provider may recommend screening at age 14 if you are at increased risk. You will have tests every 1-10 years, depending on your results and the type of screening test.  Diabetes screening. This is done by checking your blood sugar (glucose) after you have not eaten  for a while (fasting). You may have this done every 1-3 years.  Sexually transmitted disease (STD) testing. Follow these instructions at home: Eating and drinking  Eat a diet that includes fresh fruits and vegetables, whole grains, lean protein, and low-fat dairy products.  Take vitamin and mineral supplements as  recommended by your health care provider.  Do not drink alcohol if your health care provider tells you not to drink.  If you drink alcohol: ? Limit how much you have to 0-2 drinks a day. ? Be aware of how much alcohol is in your drink. In the [place] one drink equals one 12 oz bottle of beer (355 mL), one 5 oz glass of wine (148 mL), or one 1 oz glass of hard liquor (44 mL). Lifestyle  Take daily care of your teeth and gums.  Stay active. Exercise for at least 30 minutes on 5 or more days each week.  Do not use any products that contain nicotine or tobacco, such as cigarettes, e-cigarettes, and chewing tobacco. If you need help quitting, ask your health care provider.  If you are sexually active, practice safe sex. Use a condom or other form of protection to prevent STIs (sexually transmitted infections).  Talk with your health care provider about taking a low-dose aspirin every day starting at age 78. What's next?  Go to your health care provider once a year for a well check visit.  Ask your health care provider how often you should have your eyes and teeth checked.  Stay up to date on all vaccines. This information is not intended to replace advice given to you by your health care provider. Make sure you discuss any questions you have with your health care provider. Document Revised: 01/12/2018 Document Reviewed: 01/12/2018 Elsevier Patient Education  [place].  Nonspecific Chest Pain, Adult Chest pain can be caused by many different conditions. It can be caused by a condition that is life-threatening and requires treatment right away. It can also be caused by something that is not life-threatening. If you have chest pain, it can be hard to know the difference, so it is important to get help right away to make sure that you do not have a serious condition. Some life-threatening causes of chest pain include:  Heart attack.  A tear in the body's main blood vessel (aortic  dissection).  Inflammation around your heart (pericarditis).  A problem in the lungs, such as a blood clot (pulmonary embolism) or a collapsed lung (pneumothorax). Some non life-threatening causes of chest pain include:  Heartburn.  Anxiety or stress.  Damage to the bones, muscles, and cartilage that make up your chest wall.  Pneumonia or bronchitis.  Shingles infection (varicella-zoster virus). Chest pain can feel like:  Pain or discomfort on the surface of your chest or deep in your chest.  Crushing, pressure, aching, or squeezing pain.  Burning or tingling.  Dull or sharp pain that is worse when you move, cough, or take a deep breath.  Pain or discomfort that is also felt in your back, neck, jaw, shoulder, or arm, or pain that spreads to any of these areas. Your chest pain may come and go. It may also be constant. Your health care provider will do lab tests and other studies to find the cause of your pain. Treatment will depend on the cause of your chest pain. Follow these instructions at home: Medicines  Take over-the-counter and prescription medicines only as told by your health care provider.  If you were prescribed an antibiotic, take it as told by your health care provider. Do not stop taking the antibiotic even if you start to feel better. Lifestyle   Rest as directed by your health care provider.  Do not use any products that contain nicotine or tobacco, such as cigarettes and e-cigarettes. If you need help quitting, ask your health care provider.  Do not drink alcohol.  Make healthy lifestyle choices as recommended. These may include: ? Getting regular exercise. Ask your health care provider to suggest some activities that are safe for you. ? Eating a heart-healthy diet. This includes plenty of fresh fruits and vegetables, whole grains, low-fat (lean) protein, and low-fat dairy products. A dietitian can help you find healthy eating options. ? Maintaining a  healthy weight. ? Managing any other health conditions you have, such as high blood pressure (hypertension) or diabetes. ? Reducing stress, such as with yoga or relaxation techniques. General instructions  Pay attention to any changes in your symptoms. Tell your health care provider about them or any new symptoms.  Avoid any activities that cause chest pain.  Keep all follow-up visits as told by your health care provider. This is important. This includes visits for any further testing if your chest pain does not go away. Contact a health care provider if:  Your chest pain does not go away.  You feel depressed.  You have a fever. Get help right away if:  Your chest pain gets worse.  You have a cough that gets worse, or you cough up blood.  You have severe pain in your abdomen.  You faint.  You have sudden, unexplained chest discomfort.  You have sudden, unexplained discomfort in your arms, back, neck, or jaw.  You have shortness of breath at any time.  You suddenly start to sweat, or your skin gets clammy.  You feel nausea or you vomit.  You suddenly feel lightheaded or dizzy.  You have severe weakness, or unexplained weakness or fatigue.  Your heart begins to beat quickly, or it feels like it is skipping beats. These symptoms may represent a serious problem that is an emergency. Do not wait to see if the symptoms will go away. Get medical help right away. Call your local emergency services (911 in the [place] Do not drive yourself to the hospital. Summary  Chest pain can be caused by a condition that is serious and requires urgent treatment. It may also be caused by something that is not life-threatening.  If you have chest pain, it is very important to see your health care provider. Your health care provider may do lab tests and other studies to find the cause of your pain.  Follow your health care provider's instructions on taking medicines, making lifestyle changes,  and getting emergency treatment if symptoms become worse.  Keep all follow-up visits as told by your health care provider. This includes visits for any further testing if your chest pain does not go away. This information is not intended to replace advice given to you by your health care provider. Make sure you discuss any questions you have with your health care provider. Document Revised: 07/21/2017 Document Reviewed: 07/21/2017 Elsevier Patient Education  2020 [employer].  Managing Your Hypertension Hypertension is commonly called high blood pressure. This is when the force of your blood pressing against the walls of your arteries is too strong. Arteries are blood vessels that carry blood from your heart throughout your body. Hypertension forces the heart  to work harder to pump blood, and may cause the arteries to become narrow or stiff. Having untreated or uncontrolled hypertension can cause heart attack, stroke, kidney disease, and other problems. What are blood pressure readings? A blood pressure reading consists of a higher number over a lower number. Ideally, your blood pressure should be below 120/80. The first ("top") number is called the systolic pressure. It is a measure of the pressure in your arteries as your heart beats. The second ("bottom") number is called the diastolic pressure. It is a measure of the pressure in your arteries as the heart relaxes. What does my blood pressure reading mean? Blood pressure is classified into four stages. Based on your blood pressure reading, your health care provider may use the following stages to determine what type of treatment you need, if any. Systolic pressure and diastolic pressure are measured in a unit called mm Hg. Normal  Systolic pressure: below 259.  Diastolic pressure: below 80. Elevated  Systolic pressure: 563-875.  Diastolic pressure: below 80. Hypertension stage 1  Systolic pressure: 643-329.  Diastolic pressure:  51-88. Hypertension stage 2  Systolic pressure: 416 or above.  Diastolic pressure: 90 or above. What health risks are associated with hypertension? Managing your hypertension is an important responsibility. Uncontrolled hypertension can lead to:  A heart attack.  A stroke.  A weakened blood vessel (aneurysm).  Heart failure.  Kidney damage.  Eye damage.  Metabolic syndrome.  Memory and concentration problems. What changes can I make to manage my hypertension? Hypertension can be managed by making lifestyle changes and possibly by taking medicines. Your health care provider will help you make a plan to bring your blood pressure within a normal range. Eating and drinking   Eat a diet that is high in fiber and potassium, and low in salt (sodium), added sugar, and fat. An example eating plan is called the DASH (Dietary Approaches to Stop Hypertension) diet. To eat this way: ? Eat plenty of fresh fruits and vegetables. Try to fill half of your plate at each meal with fruits and vegetables. ? Eat whole grains, such as whole wheat pasta, brown rice, or whole grain bread. Fill about one quarter of your plate with whole grains. ? Eat low-fat diary products. ? Avoid fatty cuts of meat, processed or cured meats, and poultry with skin. Fill about one quarter of your plate with lean proteins such as fish, chicken without skin, beans, eggs, and tofu. ? Avoid premade and processed foods. These tend to be higher in sodium, added sugar, and fat.  Reduce your daily sodium intake. Most people with hypertension should eat less than 1,500 mg of sodium a day.  Limit alcohol intake to no more than 1 drink a day for nonpregnant women and 2 drinks a day for men. One drink equals 12 oz of beer, 5 oz of wine, or 1 oz of hard liquor. Lifestyle  Work with your health care provider to maintain a healthy body weight, or to lose weight. Ask what an ideal weight is for you.  Get at least 30 minutes of  exercise that causes your heart to beat faster (aerobic exercise) most days of the week. Activities may include walking, swimming, or biking.  Include exercise to strengthen your muscles (resistance exercise), such as weight lifting, as part of your weekly exercise routine. Try to do these types of exercises for 30 minutes at least 3 days a week.  Do not use any products that contain nicotine or tobacco,  such as cigarettes and e-cigarettes. If you need help quitting, ask your health care provider.  Control any long-term (chronic) conditions you have, such as high cholesterol or diabetes. Monitoring  Monitor your blood pressure at home as told by your health care provider. Your personal target blood pressure may vary depending on your medical conditions, your age, and other factors.  Have your blood pressure checked regularly, as often as told by your health care provider. Working with your health care provider  Review all the medicines you take with your health care provider because there may be side effects or interactions.  Talk with your health care provider about your diet, exercise habits, and other lifestyle factors that may be contributing to hypertension.  Visit your health care provider regularly. Your health care provider can help you create and adjust your plan for managing hypertension. Will I need medicine to control my blood pressure? Your health care provider may prescribe medicine if lifestyle changes are not enough to get your blood pressure under control, and if:  Your systolic blood pressure is 130 or higher.  Your diastolic blood pressure is 80 or higher. Take medicines only as told by your health care provider. Follow the directions carefully. Blood pressure medicines must be taken as prescribed. The medicine does not work as well when you skip doses. Skipping doses also puts you at risk for problems. Contact a health care provider if:  You think you are having a  reaction to medicines you have taken.  You have repeated (recurrent) headaches.  You feel dizzy.  You have swelling in your ankles.  You have trouble with your vision. Get help right away if:  You develop a severe headache or confusion.  You have unusual weakness or numbness, or you feel faint.  You have severe pain in your chest or abdomen.  You vomit repeatedly.  You have trouble breathing. Summary  Hypertension is when the force of blood pumping through your arteries is too strong. If this condition is not controlled, it may put you at risk for serious complications.  Your personal target blood pressure may vary depending on your medical conditions, your age, and other factors. For most people, a normal blood pressure is less than 120/80.  Hypertension is managed by lifestyle changes, medicines, or both. Lifestyle changes include weight loss, eating a healthy, low-sodium diet, exercising more, and limiting alcohol. This information is not intended to replace advice given to you by your health care provider. Make sure you discuss any questions you have with your health care provider. Document Revised: 05/12/2018 Document Reviewed: 12/17/2015 Elsevier Patient Education  [place].

## 2019-09-24 NOTE — Progress Notes (Signed)
Subjective:     Garrett Anderson is a 44 y.o. male and is here for a comprehensive physical exam. The patient reports problems - L rib pain.  Patient states overall doing well.  Notes intermittent left-sided chest pain x yrs.  Sensation will occur if he lays on left side at night.  Pt states a massage therapist told him he may have broken a rib and that was causing the pain.  Patient denies injury, skin changes, palpitations.  Patient is not taking them for the symptoms.    Pt states BP stable on lisinopril-HCTZ 10-12.5 mg q am.  Has noticed occasional nocturia.  At times up 3 x per night, though may not get up.  Notices if lays on side will have the urge to urinate.  Not drinking increased caffeine.  Drinks mostly water.  Tries to stop drinking by 8 PM.  Denies daytime urinary symptoms, decreased urinary stream, incomplete bladder emptying, urinary frequency.  Endorses history of cluster headaches.  Starting to notice increasing headaches.  Patient mentions needing follow-up colonoscopy.  Had a colonoscopy done at age 28 due to fam hx by Dr. Benson Norway on [address].  Pt tried to call his office to schedule however was told he did not need one.  Pt interested in having this done.  Social History   Socioeconomic History  . Marital status: Married    Spouse name: Not on file  . Number of children: Not on file  . Years of education: Not on file  . Highest education level: Not on file  Occupational History  . Not on file  Tobacco Use  . Smoking status: Never Smoker  . Smokeless tobacco: Never Used  Substance and Sexual Activity  . Alcohol use: Yes  . Drug use: No  . Sexual activity: Yes  Other Topics Concern  . Not on file  Social History Narrative   Married   IT trainer - Solic[employer]   Social Determinants of Health   Financial Resource Strain:   . Difficulty of Paying Living Expenses: Not on file  Food Insecurity:   . Worried About Charity fundraiser in the Last Year: Not on file  . Ran  Out of Food in the Last Year: Not on file  Transportation Needs:   . Lack of Transportation (Medical): Not on file  . Lack of Transportation (Non-Medical): Not on file  Physical Activity:   . Days of Exercise per Week: Not on file  . Minutes of Exercise per Session: Not on file  Stress:   . Feeling of Stress : Not on file  Social Connections:   . Frequency of Communication with Friends and Family: Not on file  . Frequency of Social Gatherings with Friends and Family: Not on file  . Attends Religious Services: Not on file  . Active Member of Clubs or Organizations: Not on file  . Attends Archivist Meetings: Not on file  . Marital Status: Not on file  Intimate Partner Violence:   . Fear of Current or Ex-Partner: Not on file  . Emotionally Abused: Not on file  . Physically Abused: Not on file  . Sexually Abused: Not on file   Health Maintenance  Topic Date Due  . Hepatitis C Screening  Never done  . COVID-19 Vaccine (1) Never done  . HIV Screening  Never done  . INFLUENZA VACCINE  09/02/2019  . TETANUS/TDAP  11/09/2025    The following portions of the patient's history were reviewed and updated  as appropriate: allergies, current medications, past family history, past medical history, past social history, past surgical history and problem list.  Review of Systems Pertinent items noted in HPI and remainder of comprehensive ROS otherwise negative.   Objective:    BP 118/78 (BP Location: Left Arm, Patient Position: Sitting, Cuff Size: Large)   Pulse 81   Temp 98.6 F (37 C) (Oral)   Ht _0  (1.727 m)   Wt 209 lb (94.8 kg)   SpO2 97%   BMI 31.78 kg/m  General appearance: alert, cooperative and no distress Head: Normocephalic, without obvious abnormality, atraumatic Eyes: conjunctivae/corneas clear. PERRL, EOM's intact. Fundi benign. Ears: normal TM's and external ear canals both ears Nose: Nares normal. Septum midline. Mucosa normal. No drainage or sinus  tenderness. Throat: lips, mucosa, and tongue normal; teeth and gums normal Neck: no adenopathy, no carotid bruit, no JVD, supple, symmetrical, trachea midline and thyroid not enlarged, symmetric, no tenderness/mass/nodules Lungs: clear to auscultation bilaterally Heart: regular rate and rhythm, S1, S2 normal, no murmur, click, rub or gallop Abdomen: soft, non-tender; bowel sounds normal; no masses,  no organomegaly Extremities: extremities normal, atraumatic, no cyanosis or edema Pulses: 2+ and symmetric Skin: Skin color, texture, turgor normal. No rashes or lesions Lymph nodes: Cervical, supraclavicular, and axillary nodes normal. Neurologic: Alert and oriented X 3, normal strength and tone. Normal symmetric reflexes. Normal coordination and gait    Assessment:    Healthy male exam with chronic L sided chest wall pain.      Plan:     Anticipatory guidance given including wearing seatbelts, smoke detectors in the home, increasing physical activity, increasing p.o. intake of water and vegetables. -We will obtain labs -Discussed placing referral for follow-up colonoscopy. -Given handout -Next CPE in 1 year See After Visit Summary for Counseling Recommendations    Essential hypertension -controlled -Continue current medication lisinopril-HCTZ 10-12.5 mg -Continue lifestyle modifications - Plan: CMP with eGFR([facility]), CMP with eGFR([facility])  Rib pain -Discussed likely musculoskeletal.  Possibly 2/2 arthritis status post rib fracture.  Also consider intercostal nerve entrapment -Given continued symptoms discussed obtaining CXR - Plan: CBC (no diff), DG Chest 2 View, CBC (no diff)  Nocturia  -Discussed possible causes -Discussed lifestyle modifications including decreasing caffeine intake, spicy food intake which can irritate the bladder. -We will continue to monitor - Plan: Hemoglobin A1c, PSA, TSH, T4, free, T4, free, TSH, PSA, Hemoglobin A1c  Colon cancer screening -Due for  repeat colonoscopy as previously done in 4s -Seen by Dr. Benson Norway - Plan: Ambulatory referral to Gastroenterology  Screening for cholesterol level -Lifestyle modifications encouraged -Continue aspirin 81 mg daily for prevention - Plan: Lipid panel, Lipid panel  Follow-up as needed  Grier Mitts, MD

## 2019-09-25 LAB — HEMOGLOBIN A1C
Hgb A1c MFr Bld: 5.7 % of total Hgb — ABNORMAL HIGH (ref <5.7)
Mean Plasma Glucose: 117 (calc)
eAG (mmol/L): 6.5 (calc)

## 2019-09-25 LAB — CBC
HCT: 44.8 % (ref 38.5–50.0)
Hemoglobin: 15 g/dL (ref 13.2–17.1)
MCH: 29.1 pg (ref 27.0–33.0)
MCHC: 33.5 g/dL (ref 32.0–36.0)
MCV: 87 fL (ref 80.0–100.0)
MPV: 10.4 fL (ref 7.5–12.5)
Platelets: 240 10*3/uL (ref 140–400)
RBC: 5.15 10*6/uL (ref 4.20–5.80)
RDW: 12.9 % (ref 11.0–15.0)
WBC: 4.6 10*3/uL (ref 3.8–10.8)

## 2019-09-25 LAB — COMPLETE METABOLIC PANEL WITH GFR
AG Ratio: 1.4 (calc) (ref 1.0–2.5)
ALT: 26 U/L (ref 9–46)
AST: 36 U/L (ref 10–40)
Albumin: 4.4 g/dL (ref 3.6–5.1)
Alkaline phosphatase (APISO): 63 U/L (ref 36–130)
BUN: 17 mg/dL (ref 7–25)
CO2: 28 mmol/L (ref 20–32)
Calcium: 9.6 mg/dL (ref 8.6–10.3)
Chloride: 100 mmol/L (ref 98–110)
Creat: 1.02 mg/dL (ref 0.60–1.35)
GFR, Est African American: 103 mL/min/{1.73_m2} (ref >=60)
GFR, Est Non African American: 89 mL/min/{1.73_m2} (ref >=60)
Globulin: 3.1 g/dL (calc) (ref 1.9–3.7)
Glucose, Bld: 93 mg/dL (ref 65–99)
Potassium: 4.9 mmol/L (ref 3.5–5.3)
Sodium: 137 mmol/L (ref 135–146)
Total Bilirubin: 0.5 mg/dL (ref 0.2–1.2)
Total Protein: 7.5 g/dL (ref 6.1–8.1)

## 2019-09-25 LAB — LIPID PANEL
Cholesterol: 204 mg/dL — ABNORMAL HIGH (ref <200)
HDL: 48 mg/dL (ref >=40)
LDL Cholesterol (Calc): 142 mg/dL (calc) — ABNORMAL HIGH
Non-HDL Cholesterol (Calc): 156 mg/dL (calc) — ABNORMAL HIGH (ref <130)
Total CHOL/HDL Ratio: 4.3 (calc) (ref <5.0)
Triglycerides: 51 mg/dL (ref <150)

## 2019-09-25 LAB — PSA: PSA: 0.5 ng/mL (ref <=4.0)

## 2019-09-25 LAB — TSH: TSH: 0.44 mIU/L (ref 0.40–4.50)

## 2019-09-25 LAB — T4, FREE: Free T4: 1.3 ng/dL (ref 0.8–1.8)

## 2019-10-03 ENCOUNTER — Other Ambulatory Visit: Payer: Self-pay

## 2019-10-04 ENCOUNTER — Ambulatory Visit: Payer: 59 | Admitting: Family Medicine

## 2019-10-04 ENCOUNTER — Encounter: Payer: Self-pay | Admitting: Family Medicine

## 2019-10-04 VITALS — BP 102/62 | HR 84 | Temp 98.5°F | Wt 209.0 lb

## 2019-10-04 DIAGNOSIS — R1012 Left upper quadrant pain: Secondary | ICD-10-CM

## 2019-10-04 DIAGNOSIS — E782 Mixed hyperlipidemia: Secondary | ICD-10-CM

## 2019-10-04 DIAGNOSIS — R351 Nocturia: Secondary | ICD-10-CM | POA: Diagnosis not present

## 2019-10-04 DIAGNOSIS — Z712 Person consulting for explanation of examination or test findings: Secondary | ICD-10-CM

## 2019-10-04 DIAGNOSIS — R7303 Prediabetes: Secondary | ICD-10-CM

## 2019-10-04 MED ORDER — PANTOPRAZOLE SODIUM 40 MG PO TBEC: 40.0000 mg | DELAYED_RELEASE_TABLET | Freq: Every day | ORAL | 2 refills | Status: DC

## 2019-10-04 NOTE — Progress Notes (Signed)
Subjective:    Patient ID: Garrett Anderson, male    DOB: [birth year], 44 y.o.   MRN: [id number]  No chief complaint on file.   HPI Patient was seen today for follow-up on lab results and continued intermittent pain in the left lower rib cage.  Cholesterol elevated and hgb A1c prediabetic range at 5.7%.  Pt also mentioned having to get each night to urinate.  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  Patient denies increased thirst, increased hunger, dyslipidemia, nausea, vomiting, decreased strength, incomplete bladder emptying.  No past medical history on file.  No Known Allergies  ROS General: Denies fever, chills, night sweats, changes in weight, changes in appetite HEENT: Denies headaches, ear pain, changes in vision, rhinorrhea, sore throat CV: Denies CP, palpitations, SOB, orthopnea Pulm: Denies SOB, cough, wheezing GI: Denies nausea, vomiting, diarrhea, constipation  + left upper quadrant abdominal pain GU: Denies dysuria, hematuria, frequency  + nocturia Msk: Denies muscle cramps, joint pains Neuro: Denies weakness, numbness, tingling Skin: Denies rashes, bruising Psych: Denies depression, anxiety, hallucinations      Objective:    Blood pressure 102/62, pulse 84, temperature 98.5 F (36.9 C), temperature source Oral, weight 209 lb (94.8 kg), SpO2 98 %.  Gen. Pleasant, well-nourished, in no distress, normal affect   HEENT: [place]/AT, face symmetric, conjunctiva clear, no scleral icterus, PERRLA, EOMI, nares patent without drainage Lungs: no accessory muscle use Cardiovascular: RRR, no peripheral edema Abdomen: BS present, soft, NT/ND, no hepatosplenomegaly. Musculoskeletal: No deformities, no cyanosis or clubbing, normal tone Neuro:  A&Ox3, CN II-XII intact, normal gait Skin:  Warm, no lesions/ rash   Wt Readings from Last 3 Encounters:  09/24/19 209 lb (94.8  kg)  09/28/18 215 lb (97.5 kg)  02/17/18 214 lb (97.1 kg)    Lab Results  Component Value Date   WBC 4.6 09/24/2019   HGB 15.0 09/24/2019   HCT 44.8 09/24/2019   PLT 240 09/24/2019   GLUCOSE 93 09/24/2019   CHOL 204 (H) 09/24/2019   TRIG 51 09/24/2019   HDL 48 09/24/2019   LDLCALC 142 (H) 09/24/2019   ALT 26 09/24/2019   AST 36 09/24/2019   NA 137 09/24/2019   K 4.9 09/24/2019   CL 100 09/24/2019   CREATININE 1.02 09/24/2019   BUN 17 09/24/2019   CO2 28 09/24/2019   TSH 0.44 09/24/2019   PSA 0.5 09/24/2019   HGBA1C 5.7 (H) 09/24/2019    Assessment/Plan:  Left upper quadrant abdominal pain  -Discussed possible causes including ulcer, GERD.  Infection or fracture less likely as CXR on 09/24/2019 -We will start trial of Protonix -Patient to reduce intake of spicy foods -For continued or worsening symptoms consider referral to GI. - Plan: pantoprazole (PROTONIX) 40 MG tablet  Nocturia -Discussed HCTZ and combined blood pressure pill can cause urinary frequency. -Discussed decreasing caffeine intake and limiting fluids prior to bed. -Hemoglobin A1c 5.7% on 09/24/2019 -We will continue to monitor  Encounter to discuss test results -Reviewed elevated cholesterol and hemoglobin A1c indicating prediabetes  Prediabetes -Hemoglobin A1c 5.7% on 09/25/2019 -Discussed diet and lifestyle modifications -Continue to monitor  Mixed hyperlipidemia -Reviewed cholesterol results from 09/25/2019.  Total cholesterol 204 and LDL 142 -Statin not indicated at this time -Discussed lifestyle modifications -Given handout -We will recheck in 1 6 months to 1 year  F/u as needed  Abbe Amsterdam, MD

## 2019-10-04 NOTE — Patient Instructions (Addendum)
Counseling options: [email or web address] Maggie Font, [facility] [phone]   Abdominal Pain, Adult Pain in the abdomen (abdominal pain) can be caused by many things. Often, abdominal pain is not serious and it gets better with no treatment or by being treated at home. However, sometimes abdominal pain is serious. Your health care provider will ask questions about your medical history and do a physical exam to try to determine the cause of your abdominal pain. Follow these instructions at home:  Medicines  Take over-the-counter and prescription medicines only as told by your health care provider.  Do not take a laxative unless told by your health care provider. General instructions  Watch your condition for any changes.  Drink enough fluid to keep your urine pale yellow.  Keep all follow-up visits as told by your health care provider. This is important. Contact a health care provider if:  Your abdominal pain changes or gets worse.  You are not hungry or you lose weight without trying.  You are constipated or have diarrhea for more than 2-3 days.  You have pain when you urinate or have a bowel movement.  Your abdominal pain wakes you up at night.  Your pain gets worse with meals, after eating, or with certain foods.  You are vomiting and cannot keep anything down.  You have a fever.  You have blood in your urine. Get help right away if:  Your pain does not go away as soon as your health care provider told you to expect.  You cannot stop vomiting.  Your pain is only in areas of the abdomen, such as the right side or the left lower portion of the abdomen. Pain on the right side could be caused by appendicitis.  You have bloody or black stools, or stools that look like tar.  You have severe pain, cramping, or bloating in your abdomen.  You have signs of dehydration, such as: ? Dark urine, very little urine, or no urine. ? Cracked lips. ? Dry mouth. ? Sunken  eyes. ? Sleepiness. ? Weakness.  You have trouble breathing or chest pain. Summary  Often, abdominal pain is not serious and it gets better with no treatment or by being treated at home. However, sometimes abdominal pain is serious.  Watch your condition for any changes.  Take over-the-counter and prescription medicines only as told by your health care provider.  Contact a health care provider if your abdominal pain changes or gets worse.  Get help right away if you have severe pain, cramping, or bloating in your abdomen. This information is not intended to replace advice given to you by your health care provider. Make sure you discuss any questions you have with your health care provider. Document Revised: 05/29/2018 Document Reviewed: 05/29/2018 Elsevier Patient Education  2020 Else[employer] Inc[employer]  Urinary Frequency, Adult Urinary frequency means urinating more often than usual. You may urinate every 1-2 hours even though you drink a normal amount of fluid and do not have a bladder infection or condition. Although you urinate more often than normal, the total amount of urine produced in a day is normal. With urinary frequency, you may have an urgent need to urinate often. The stress and anxiety of needing to find a bathroom quickly can make this urge worse. This condition may go away on its own or you may need treatment at home. Home treatment may include bladder training, exercises, taking medicines, or making changes to your diet. Follow these instructions at home: Bladder health  Keep a bladder diary if told by your health care provider. Keep track of: ? What you eat and drink. ? How often you urinate. ? How much you urinate.  Follow a bladder training program if told by your health care provider. This may include: ? Learning to delay going to the bathroom. ? Double urinating (voiding). This helps if you are not completely emptying your bladder. ? Scheduled voiding.  Do Kegel  exercises as told by your health care provider. Kegel exercises strengthen the muscles that help control urination, which may help the condition. Eating and drinking  If told by your health care provider, make diet changes, such as: ? Avoiding caffeine. ? Drinking fewer fluids, especially alcohol. ? Not drinking in the evening. ? Avoiding foods or drinks that may irritate the bladder. These include coffee, tea, soda, artificial sweeteners, citrus, tomato-based foods, and chocolate. ? Eating foods that help prevent or ease constipation. Constipation can make this condition worse. Your health care provider may recommend that you:  Drink enough fluid to keep your urine pale yellow.  Take over-the-counter or prescription medicines.  Eat foods that are high in fiber, such as beans, whole grains, and fresh fruits and vegetables.  Limit foods that are high in fat and processed sugars, such as fried or sweet foods. General instructions  Take over-the-counter and prescription medicines only as told by your health care provider.  Keep all follow-up visits as told by your health care provider. This is important. Contact a health care provider if:  You start urinating more often.  You feel pain or irritation when you urinate.  You notice blood in your urine.  Your urine looks cloudy.  You develop a fever.  You begin vomiting. Get help right away if:  You are unable to urinate. Summary  Urinary frequency means urinating more often than usual. With urinary frequency, you may urinate every 1-2 hours even though you drink a normal amount of fluid and do not have a bladder infection or other bladder condition.  Your health care provider may recommend that you keep a bladder diary, follow a bladder training program, or make dietary changes.  If told by your health care provider, do Kegel exercises to strengthen the muscles that help control urination.  Take over-the-counter and  prescription medicines only as told by your health care provider.  Contact a health care provider if your symptoms do not improve or get worse. This information is not intended to replace advice given to you by your health care provider. Make sure you discuss any questions you have with your health care provider. Document Revised: 07/28/2017 Document Reviewed: 07/28/2017 Elsevier Patient Education  2020 [employer]

## 2019-11-20 ENCOUNTER — Other Ambulatory Visit: Payer: Self-pay | Admitting: Family Medicine

## 2019-11-20 DIAGNOSIS — I10 Essential (primary) hypertension: Secondary | ICD-10-CM

## 2020-01-01 ENCOUNTER — Other Ambulatory Visit: Payer: Self-pay | Admitting: Family Medicine

## 2020-01-01 DIAGNOSIS — I10 Essential (primary) hypertension: Secondary | ICD-10-CM

## 2020-02-21 ENCOUNTER — Other Ambulatory Visit: Payer: Self-pay | Admitting: Family Medicine

## 2020-02-21 DIAGNOSIS — I10 Essential (primary) hypertension: Secondary | ICD-10-CM

## 2020-04-21 ENCOUNTER — Other Ambulatory Visit: Payer: Self-pay | Admitting: Family Medicine

## 2020-04-21 DIAGNOSIS — I10 Essential (primary) hypertension: Secondary | ICD-10-CM

## 2020-08-07 ENCOUNTER — Ambulatory Visit: Payer: 59 | Admitting: Family Medicine

## 2020-09-24 ENCOUNTER — Other Ambulatory Visit: Payer: Self-pay | Admitting: Family Medicine

## 2020-09-24 DIAGNOSIS — I10 Essential (primary) hypertension: Secondary | ICD-10-CM

## 2020-10-21 ENCOUNTER — Other Ambulatory Visit: Payer: Self-pay

## 2020-10-22 ENCOUNTER — Ambulatory Visit (INDEPENDENT_AMBULATORY_CARE_PROVIDER_SITE_OTHER): Payer: 59 | Admitting: Family Medicine

## 2020-10-22 ENCOUNTER — Encounter: Payer: Self-pay | Admitting: Family Medicine

## 2020-10-22 VITALS — BP 132/88 | HR 71 | Temp 98.5°F | Ht 68.5 in | Wt 215.4 lb

## 2020-10-22 DIAGNOSIS — Z1159 Encounter for screening for other viral diseases: Secondary | ICD-10-CM

## 2020-10-22 DIAGNOSIS — Z131 Encounter for screening for diabetes mellitus: Secondary | ICD-10-CM | POA: Diagnosis not present

## 2020-10-22 DIAGNOSIS — H6121 Impacted cerumen, right ear: Secondary | ICD-10-CM

## 2020-10-22 DIAGNOSIS — Z0001 Encounter for general adult medical examination with abnormal findings: Secondary | ICD-10-CM

## 2020-10-22 DIAGNOSIS — Z125 Encounter for screening for malignant neoplasm of prostate: Secondary | ICD-10-CM

## 2020-10-22 DIAGNOSIS — I10 Essential (primary) hypertension: Secondary | ICD-10-CM

## 2020-10-22 DIAGNOSIS — Z0000 Encounter for general adult medical examination without abnormal findings: Secondary | ICD-10-CM

## 2020-10-22 LAB — LIPID PANEL
Cholesterol: 194 mg/dL (ref 0–200)
HDL: 48 mg/dL (ref >39.00)
LDL Cholesterol: 138 mg/dL — ABNORMAL HIGH (ref 0–99)
NonHDL: 146.34
Total CHOL/HDL Ratio: 4
Triglycerides: 42 mg/dL (ref 0.0–149.0)
VLDL: 8.4 mg/dL (ref 0.0–40.0)

## 2020-10-22 LAB — CBC WITH DIFFERENTIAL/PLATELET
Basophils Absolute: 0 10*3/uL (ref 0.0–0.1)
Basophils Relative: 0.8 % (ref 0.0–3.0)
Eosinophils Absolute: 0.2 10*3/uL (ref 0.0–0.7)
Eosinophils Relative: 6.1 % — ABNORMAL HIGH (ref 0.0–5.0)
HCT: 43.2 % (ref 39.0–52.0)
Hemoglobin: 14.2 g/dL (ref 13.0–17.0)
Lymphocytes Relative: 36.8 % (ref 12.0–46.0)
Lymphs Abs: 1.3 10*3/uL (ref 0.7–4.0)
MCHC: 32.8 g/dL (ref 30.0–36.0)
MCV: 88.2 fl (ref 78.0–100.0)
Monocytes Absolute: 0.3 10*3/uL (ref 0.1–1.0)
Monocytes Relative: 9.6 % (ref 3.0–12.0)
Neutro Abs: 1.7 10*3/uL (ref 1.4–7.7)
Neutrophils Relative %: 46.7 % (ref 43.0–77.0)
Platelets: 208 10*3/uL (ref 150.0–400.0)
RBC: 4.9 Mil/uL (ref 4.22–5.81)
RDW: 13.1 % (ref 11.5–15.5)
WBC: 3.6 10*3/uL — ABNORMAL LOW (ref 4.0–10.5)

## 2020-10-22 LAB — COMPREHENSIVE METABOLIC PANEL
ALT: 17 U/L (ref 0–53)
AST: 16 U/L (ref 0–37)
Albumin: 4.1 g/dL (ref 3.5–5.2)
Alkaline Phosphatase: 59 U/L (ref 39–117)
BUN: 12 mg/dL (ref 6–23)
CO2: 29 mEq/L (ref 19–32)
Calcium: 9.2 mg/dL (ref 8.4–10.5)
Chloride: 103 mEq/L (ref 96–112)
Creatinine, Ser: 1.03 mg/dL (ref 0.40–1.50)
GFR: 87.97 mL/min (ref >60.00)
Glucose, Bld: 89 mg/dL (ref 70–99)
Potassium: 4.2 mEq/L (ref 3.5–5.1)
Sodium: 139 mEq/L (ref 135–145)
Total Bilirubin: 0.5 mg/dL (ref 0.2–1.2)
Total Protein: 7 g/dL (ref 6.0–8.3)

## 2020-10-22 LAB — HEMOGLOBIN A1C: Hgb A1c MFr Bld: 5.9 % (ref 4.6–6.5)

## 2020-10-22 LAB — T4, FREE: Free T4: 0.89 ng/dL (ref 0.60–1.60)

## 2020-10-22 LAB — PSA: PSA: 0.5 ng/mL (ref 0.10–4.00)

## 2020-10-22 LAB — TSH: TSH: 0.93 u[IU]/mL (ref 0.35–5.50)

## 2020-10-22 NOTE — Progress Notes (Signed)
Subjective:     Garrett Anderson is a 45 y.o. male and is here for a comprehensive physical exam. Pt states he is doing well.  Under some stress 2/2 undergoing the promotion process at work.  Otherwise doing well.  No longer taking protonix.  Every once and awhile notes intermittent discomfort in LUQ.  Occurs with eating certain foods.  Patient received letter to schedule colonoscopy.  Previously had a colonoscopy in his 30s.  Endorses family history of cancer.  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   Social Determinants of Health   Financial Resource Strain: Not on file  Food Insecurity: Not on file  Transportation Needs: Not on file  Physical Activity: Not on file  Stress: Not on file  Social Connections: Not on file  Intimate Partner Violence: Not on file   Health Maintenance  Topic Date Due   HIV Screening  Never done   Hepatitis C Screening  Never done   COVID-19 Vaccine (3 - Booster for [employer] series) 06/20/2020   INFLUENZA VACCINE  Never done   COLONOSCOPY (Pts 45-25yrs Insurance coverage will need to be confirmed)  Never done   TETANUS/TDAP  11/09/2025   HPV VACCINES  Aged Out    The following portions of the patient's history were reviewed and updated as appropriate: allergies, current medications, past family history, past medical history, past social history, past surgical history, and problem list.  Review of Systems Pertinent items noted in HPI and remainder of comprehensive ROS otherwise negative.   Objective:     BP 124/88 (BP Location: Left Arm, Patient Position: Sitting, Cuff Size: Large)   Pulse 71   Temp 98.5 F (36.9 C) (Oral)   Ht 5' 8.5" (1.74 m)   Wt 215 lb 6.4 oz (97.7 kg)   SpO2 98%   BMI 32.28 kg/m  General appearance: alert, cooperative, and no distress Head: Normocephalic, without obvious abnormality, atraumatic Eyes: conjunctivae/corneas clear. PERRL, EOM's intact. Fundi benign. Ears: Normal external ears and canals bilaterally.  Small amount of cerumen present at opening of left canal.  Left TM normal.  Right canal occluded with soft cerumen.  Right TM normal after irrigation. Nose: Nares normal. Septum midline. Mucosa normal. No drainage or sinus tenderness. Throat: lips, mucosa, and tongue normal; teeth and gums normal Neck: no adenopathy, no carotid bruit, no JVD, supple, symmetrical, trachea midline, and thyroid not enlarged, symmetric, no tenderness/mass/nodules Lungs: clear to auscultation bilaterally Heart: regular rate and rhythm, S1, S2 normal, no murmur, click, rub or gallop Abdomen: soft, non-tender; bowel sounds normal; no masses,  no organomegaly Extremities: extremities normal, atraumatic, no cyanosis or edema Pulses: 2+ and symmetric Skin: Skin color, texture, turgor normal. No rashes or lesions Lymph nodes: Cervical, supraclavicular, and axillary nodes normal. Neurologic: Alert and oriented X 3, normal strength and tone. Normal symmetric reflexes. Normal coordination and gait    Assessment:    Healthy male exam.  Plan:    Anticipatory guidance given including wearing seatbelts, smoke detectors in the home, increasing physical activity, increasing p.o. intake of water and vegetables. -We will obtain labs -Colonoscopy to be scheduled later this year.  Patient with a bladder. -Discussed immunizations.  Patient declines influenza vaccine this visit -Given handout -Neck CPE in 1 year See After Visit Summary for Counseling Recommendations   Essential  hypertension  -Elevated -We will recheck -Continue lisinopril-hydrochlorothiazide 10-12.5 mg taking half tab daily -Check BP at home for continued elevation start whole tab - Plan: Lipid panel, CMP  Impacted cerumen of right ear -Consent obtained.  Right ear irrigated.  Patient tolerated procedure well. -Consider OTC Debrox eardrops  Screening for prostate cancer - Plan: PSA  Screening for diabetes mellitus  - Plan: Hemoglobin A1c  Encounter for hepatitis C screening test for low risk patient  - Plan: Hep C Antibody  F/u in 6 months for HTN, 1 year for CPE  Abbe Amsterdam, MD

## 2020-10-23 LAB — HEPATITIS C ANTIBODY
Hepatitis C Ab: NONREACTIVE
SIGNAL TO CUT-OFF: 0.01 (ref <1.00)

## 2020-11-18 ENCOUNTER — Other Ambulatory Visit: Payer: Self-pay | Admitting: Family Medicine

## 2020-11-18 DIAGNOSIS — I10 Essential (primary) hypertension: Secondary | ICD-10-CM

## 2021-01-29 LAB — HM COLONOSCOPY

## 2021-02-06 ENCOUNTER — Encounter: Payer: Self-pay | Admitting: Family Medicine

## 2021-02-17 ENCOUNTER — Ambulatory Visit: Payer: 59 | Admitting: Family Medicine

## 2021-02-17 ENCOUNTER — Telehealth: Payer: Self-pay | Admitting: Family Medicine

## 2021-02-17 VITALS — BP 160/80 | HR 95 | Temp 98.2°F | Wt 218.6 lb

## 2021-02-17 DIAGNOSIS — J0100 Acute maxillary sinusitis, unspecified: Secondary | ICD-10-CM | POA: Diagnosis not present

## 2021-02-17 MED ORDER — HYDROCODONE BIT-HOMATROP MBR 5-1.5 MG/5ML PO SOLN: 5.0000 mL | Freq: Three times a day (TID) | ORAL | 0 refills | Status: DC | PRN

## 2021-02-17 MED ORDER — AMOXICILLIN-POT CLAVULANATE 875-125 MG PO TABS: 1.0000 | ORAL_TABLET | Freq: Two times a day (BID) | ORAL | 0 refills | Status: DC

## 2021-02-17 NOTE — Progress Notes (Signed)
Established Patient Office Visit  Subjective:  Patient ID: Garrett Anderson, male    DOB: Jun 17, 1975  Age: 46 y.o. MRN: [id number]  CC:  Chief Complaint  Patient presents with   Cough    Cough, congestion, drainage, sinus pressure x 2 weeks, covid test negative at home     HPI Garrett Anderson presents for persistent cough and concern for developing sinusitis.  He states he had typical cold-like symptoms starting around January 2.  Home COVID test negative.  He feels like overall he started to improve in terms of the cold symptoms but then developed increased sinus pressure especially maxillary sinuses.  No headaches.  Brownish to greenish nasal discharge.  Cough is fairly severe at night and interfering with sleep.  No relief with over-the-counter cough medications.  He denies any history of sinusitis.  No major sore throat symptoms.  No past medical history on file.  Past Surgical History:  Procedure Laterality Date   ACHILLES TENDON SURGERY  02/02/03   left    Family History  Problem Relation Age of Onset   Cancer Mother        lung   Cancer Paternal Grandfather     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     lisinopril-hydrochlorothiazide (ZESTORETIC) 10-12.5 MG tablet TAKE 1/2 (ONE-HALF) TABLET BY MOUTH ONCE DAILY AS NEEDED . APPOINTMENT REQUIRED FOR FUTURE REFILLS 15 tablet 3   pantoprazole (PROTONIX) 40 MG tablet Take 1 tablet (40 mg total) by mouth daily. 30 tablet 2   No facility-administered medications prior to visit.    No Known Allergies  ROS Review of Systems  Constitutional:  Negative for chills and fever.  HENT:  Positive for congestion, sinus pressure and sinus pain. Negative for sore throat.   Respiratory:  Positive for cough. Negative for shortness of breath and wheezing.      Objective:    Physical Exam Vitals reviewed.  Constitutional:      Appearance: Normal appearance.  HENT:     Right Ear: Tympanic membrane normal.     Left Ear: Tympanic membrane normal.     Mouth/Throat:     Pharynx: Oropharynx is clear. No oropharyngeal exudate or posterior oropharyngeal erythema.  Cardiovascular:     Rate and Rhythm: Normal rate and regular rhythm.  Pulmonary:     Effort: Pulmonary effort is normal.     Breath sounds: Normal breath sounds. No wheezing or rales.  Musculoskeletal:     Cervical back:  Neck supple.  Lymphadenopathy:     Cervical: No cervical adenopathy.  Neurological:     Mental Status: He is alert.    BP (!) 160/80 (BP Location: Left Arm, Patient Position: Sitting, Cuff Size: Normal)    Pulse 95    Temp 98.2 F (36.8 C) (Oral)    Wt 218 lb 9.6 oz (99.2 kg)    SpO2 98%    BMI 32.75 kg/m  Wt Readings from Last 3 Encounters:  02/17/21 218 lb 9.6 oz (99.2 kg)  10/22/20 215 lb 6.4 oz (97.7 kg)  10/04/19 209 lb (94.8 kg)     Health Maintenance Due  Topic Date Due   HIV Screening  Never done   COVID-19 Vaccine (3 - Booster for Pfizer series) 03/17/2020   INFLUENZA VACCINE  Never done    There are no preventive care reminders to display for this patient.  Lab  Results  Component Value Date   TSH 0.93 10/22/2020   Lab Results  Component Value Date   WBC 3.6 (L) 10/22/2020   HGB 14.2 10/22/2020   HCT 43.2 10/22/2020   MCV 88.2 10/22/2020   PLT 208.0 10/22/2020   Lab Results  Component Value Date   NA 139 10/22/2020   K 4.2 10/22/2020   CO2 29 10/22/2020   GLUCOSE 89 10/22/2020   BUN 12 10/22/2020   CREATININE 1.03 10/22/2020   BILITOT 0.5 10/22/2020   ALKPHOS 59 10/22/2020   AST 16 10/22/2020   ALT 17 10/22/2020   PROT 7.0 10/22/2020   ALBUMIN 4.1 10/22/2020   CALCIUM 9.2 10/22/2020   GFR 87.97 10/22/2020   Lab Results  Component Value Date   CHOL 194 10/22/2020   Lab Results  Component Value Date   HDL 48.00 10/22/2020   Lab Results  Component Value Date   LDLCALC 138 (H) 10/22/2020   Lab Results  Component Value Date   TRIG 42.0 10/22/2020   Lab Results  Component Value Date   CHOLHDL 4 10/22/2020   Lab Results  Component Value Date   HGBA1C 5.9 10/22/2020      Assessment & Plan:   Problem List Items Addressed This Visit   None Visit Diagnoses     Acute non-recurrent maxillary sinusitis    -  Primary   Relevant Medications   amoxicillin-clavulanate (AUGMENTIN) 875-125 MG tablet   HYDROcodone bit-homatropine (HYCODAN) 5-1.5 MG/5ML syrup     Patient developed typical viral URI symptoms over 2 weeks ago.  Now has progressive facial pain especially maxillary sinuses.  Suspect acute sinusitis.  -Augmentin 875 mg twice daily with food for 10 days -Hycodan cough syrup 1 teaspoon every 6 hours as needed for severe cough.  He knows this may be sedating.  He plans to take this predominantly at night  Meds ordered this encounter  Medications   amoxicillin-clavulanate (AUGMENTIN) 875-125 MG tablet    Sig: Take 1 tablet by mouth 2 (two) times daily.    Dispense:  20 tablet    Refill:  0   HYDROcodone bit-homatropine (HYCODAN) 5-1.5 MG/5ML syrup    Sig: Take 5 mLs by mouth every 8 (eight) hours as needed for  cough.    Dispense:  120 mL    Refill:  0    Follow-up: No follow-ups on file.    Evelena Peat, MD

## 2021-02-17 NOTE — Telephone Encounter (Signed)
Patient calling in with respiratory symptoms: Shortness of breath, chest pain, palpitations or other red words send to Triage  Does the patient have a fever over 100, cough, congestion, sore throat, runny nose, lost of taste/smell (please list symptoms that patient has)?Congestion, drainage, slight coughing during the day that turns into coughing fits at night  What date did symptoms start?01/02 then got better, came back (If over 5 days ago, pt may be scheduled for in person visit)  Have you tested for Covid in the last 5 days? Yes   If yes, was it positive OR negative [x] ? If positive in the last 5 days, please schedule virtual visit now. If negative, schedule for an in person OV with the next available provider if PCP has no openings. Please also let patient know they will be tested again (follow the script below)  "you will have to arrive prior to your appt time to be Covid tested. Please park in back of office at the [facility] & call [phone] to let the staff know you have arrived. A staff member will meet you at your car to do a rapid covid test. Once the test has resulted you will be notified by phone of your results to determine if appt will remain an in person visit or be converted to a virtual/phone visit. If you arrive [phone] than [phone] before your appt time, your visit will be automatically converted to virtual & any recommended testing will happen AFTER the visit."   THINGS TO REMEMBER  If no availability for virtual visit in office,  please schedule another [place] office  If no availability at another [place] office, please instruct patient that they can schedule an evisit or virtual visit through their mychart account. Visits up to 8pm  patients can be seen in office 5 days after positive COVID test

## 2021-03-18 ENCOUNTER — Ambulatory Visit (INDEPENDENT_AMBULATORY_CARE_PROVIDER_SITE_OTHER): Payer: 59 | Admitting: Family Medicine

## 2021-03-18 ENCOUNTER — Encounter: Payer: Self-pay | Admitting: Family Medicine

## 2021-03-18 VITALS — BP 148/78 | HR 85 | Temp 98.2°F | Wt 217.6 lb

## 2021-03-18 DIAGNOSIS — R1012 Left upper quadrant pain: Secondary | ICD-10-CM

## 2021-03-18 DIAGNOSIS — I10 Essential (primary) hypertension: Secondary | ICD-10-CM

## 2021-03-18 DIAGNOSIS — G8929 Other chronic pain: Secondary | ICD-10-CM | POA: Diagnosis not present

## 2021-03-18 DIAGNOSIS — M545 Low back pain, unspecified: Secondary | ICD-10-CM

## 2021-03-18 LAB — CBC WITH DIFFERENTIAL/PLATELET
Basophils Absolute: 0 10*3/uL (ref 0.0–0.1)
Basophils Relative: 0.8 % (ref 0.0–3.0)
Eosinophils Absolute: 0.3 10*3/uL (ref 0.0–0.7)
Eosinophils Relative: 4.9 % (ref 0.0–5.0)
HCT: 43.3 % (ref 39.0–52.0)
Hemoglobin: 14.3 g/dL (ref 13.0–17.0)
Lymphocytes Relative: 30.9 % (ref 12.0–46.0)
Lymphs Abs: 1.6 10*3/uL (ref 0.7–4.0)
MCHC: 33.1 g/dL (ref 30.0–36.0)
MCV: 87 fl (ref 78.0–100.0)
Monocytes Absolute: 0.6 10*3/uL (ref 0.1–1.0)
Monocytes Relative: 11.3 % (ref 3.0–12.0)
Neutro Abs: 2.7 10*3/uL (ref 1.4–7.7)
Neutrophils Relative %: 52.1 % (ref 43.0–77.0)
Platelets: 194 10*3/uL (ref 150.0–400.0)
RBC: 4.98 Mil/uL (ref 4.22–5.81)
RDW: 13.4 % (ref 11.5–15.5)
WBC: 5.3 10*3/uL (ref 4.0–10.5)

## 2021-03-18 MED ORDER — PANTOPRAZOLE SODIUM 40 MG PO TBEC: 40.0000 mg | DELAYED_RELEASE_TABLET | Freq: Every day | ORAL | 2 refills | Status: DC

## 2021-03-18 NOTE — Progress Notes (Signed)
Subjective:    Patient ID: Garrett Anderson, male    DOB: 03/23/1975, 46 y.o.   MRN: [id number]  Chief Complaint  Patient presents with   Back Pain    Ongoing issue, pain comes and goes, bilat low back   Flank Pain    Right side under the ribs, discomfort that happens after eating    HPI Patient was seen today for ongoing issues.  Patient endorses continued bilateral low back pain that comes and does.  Patient states more of a tightness in muscles and a pain.  Patient works out regularly.  Is employed as a Airline pilot, currently drives the truck.  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  Tries to get up and move around.  Has an adjustable mattress.  States the [place] sitting helps with back.  Patient denies pain in LEs, weakness in LEs, fever, chills, recent injury.  Patient also mentions a discomfort in LUQ times several months.  States the sensation is more of an irritation after eating.  Does not happen all the time.  Patient does not recall an association with certain foods causing symptoms.  Occasionally has RUQ pain.  Denies constipation, nausea, vomiting, increased flatus, bloating, burning/pressure in chest, sour acid taste in mouth.  Patient had colonoscopy 01/29/2021.  Advised to repeat in 10 years.  Pt endorses taking lisinopril-hydrochlorothiazide 10-12.5 mg a half tab daily for BP.  History reviewed. No pertinent past medical history.  No Known Allergies  ROS General: Denies fever, chills, night sweats, changes in weight, changes in appetite HEENT: Denies headaches, ear pain, changes in vision, rhinorrhea, sore throat CV: Denies CP, palpitations, SOB, orthopnea Pulm: Denies SOB, cough, wheezing GI: Denies nausea, vomiting, diarrhea, constipation + RUQ pain GU: Denies dysuria, hematuria, frequency Msk: Denies muscle cramps, joint pains + bilateral low back pain Neuro: Denies weakness, numbness, tingling Skin: Denies rashes, bruising Psych: Denies  depression, anxiety, hallucinations     Objective:    Blood pressure (!) 148/78, pulse 85, temperature 98.2 F (36.8 C), temperature source Oral, weight 217 lb 9.6 oz (98.7 kg), SpO2 99 %.   Gen. Pleasant, well-nourished, in no distress, normal affect   HEENT: [place]/AT, face symmetric, conjunctiva clear, no scleral icterus, PERRLA, EOMI, nares patent without drainage Lungs: no accessory muscle use, CTAB, no wheezes or rales Cardiovascular: RRR, no m/r/g, no peripheral edema Abdomen: BS present, soft, NT/ND, no hepatosplenomegaly. Musculoskeletal: No TTP of cervical, thoracic, lumbar spine midline or paraspinal muscles.  Left lumbar paraspinal muscle tighter than right, without spasm.  No deformities, no cyanosis or clubbing, normal tone Neuro:  A&Ox3, CN II-XII intact, normal gait Skin:  Warm, no lesions/ rash   Wt Readings from Last 3 Encounters:  03/18/21 217 lb 9.6 oz (98.7 kg)  02/17/21 218 lb 9.6 oz (99.2 kg)  10/22/20 215 lb 6.4 oz (97.7 kg)    Lab Results  Component Value Date   WBC 3.6 (L) 10/22/2020   HGB 14.2 10/22/2020   HCT 43.2 10/22/2020   PLT 208.0 10/22/2020   GLUCOSE 89 10/22/2020   CHOL 194 10/22/2020   TRIG 42.0 10/22/2020   HDL 48.00 10/22/2020   LDLCALC 138 (H) 10/22/2020   ALT 17 10/22/2020   AST 16 10/22/2020   NA 139 10/22/2020   K 4.2 10/22/2020   CL 103 10/22/2020   CREATININE 1.03 10/22/2020   BUN 12 10/22/2020   CO2 29 10/22/2020   TSH 0.93 10/22/2020   PSA 0.50 10/22/2020   HGBA1C 5.9 10/22/2020  Assessment/Plan:  Left upper quadrant abdominal pain -Discussed possible causes including GERD, gastric ulcer, stress, constipation, etc. -Discussed obtaining labs -Discussed restarting Protonix 40 mg daily -For continued or worsening symptoms follow-up with GI for EGD - Plan: CMP, CBC with Differential/Platelet, Lipase, Gamma GT, pantoprazole (PROTONIX) 40 MG tablet  Chronic bilateral low back pain without sciatica -Symptoms likely 2/2  muscle strain -Discussed stretching exercises and other supportive care including OTC NSAIDs or Tylenol, topical analgesics, massage, etc. -Discussed proper body mechanics when lifting and ergonomics -Consider PT for continued or worsening symptoms  Essential hypertension -Uncontrolled -Discussed the importance of lifestyle modifications -Discussed increasing lisinopril-hydrochlorothiazide 10-12.5 mg to a whole tab.  Patient wishes to work on lifestyle modifications prior to making the adjustment. -We will continue lisinopril-hydrochlorothiazide 10-12.5 mg half tab daily.  For continued elevation greater than 140/90 will increase medication.  F/u in 4-6 weeks  More than 50% of over 33-35 minutes spent in total in caring for this patient was spent face-to-face, reviewing the chart, counseling and/or coordinating care.   Grier Mitts, MD

## 2021-03-19 LAB — COMPREHENSIVE METABOLIC PANEL
ALT: 20 U/L (ref 0–53)
AST: 19 U/L (ref 0–37)
Albumin: 4.4 g/dL (ref 3.5–5.2)
Alkaline Phosphatase: 59 U/L (ref 39–117)
BUN: 15 mg/dL (ref 6–23)
CO2: 31 mEq/L (ref 19–32)
Calcium: 9.9 mg/dL (ref 8.4–10.5)
Chloride: 100 mEq/L (ref 96–112)
Creatinine, Ser: 1.02 mg/dL (ref 0.40–1.50)
GFR: 88.76 mL/min (ref >60.00)
Glucose, Bld: 83 mg/dL (ref 70–99)
Potassium: 3.8 mEq/L (ref 3.5–5.1)
Sodium: 136 mEq/L (ref 135–145)
Total Bilirubin: 0.4 mg/dL (ref 0.2–1.2)
Total Protein: 8.4 g/dL — ABNORMAL HIGH (ref 6.0–8.3)

## 2021-03-19 LAB — GAMMA GT: GGT: 23 U/L (ref 7–51)

## 2021-03-19 LAB — LIPASE: Lipase: 65 U/L — ABNORMAL HIGH (ref 11.0–59.0)

## 2021-03-23 ENCOUNTER — Other Ambulatory Visit: Payer: Self-pay | Admitting: Family Medicine

## 2021-03-23 DIAGNOSIS — R1012 Left upper quadrant pain: Secondary | ICD-10-CM

## 2021-03-23 DIAGNOSIS — R748 Abnormal levels of other serum enzymes: Secondary | ICD-10-CM

## 2021-03-24 ENCOUNTER — Ambulatory Visit (HOSPITAL_BASED_OUTPATIENT_CLINIC_OR_DEPARTMENT_OTHER): Payer: 59

## 2021-03-30 ENCOUNTER — Ambulatory Visit (HOSPITAL_BASED_OUTPATIENT_CLINIC_OR_DEPARTMENT_OTHER)
Admission: RE | Admit: 2021-03-30 | Discharge: 2021-03-30 | Disposition: A | Discharge status: Home / Self Care | Payer: 59 | Source: Ambulatory Visit | Attending: Family Medicine | Admitting: Family Medicine

## 2021-03-30 ENCOUNTER — Other Ambulatory Visit: Payer: Self-pay

## 2021-03-30 ENCOUNTER — Encounter (HOSPITAL_BASED_OUTPATIENT_CLINIC_OR_DEPARTMENT_OTHER): Payer: Self-pay

## 2021-03-30 DIAGNOSIS — R748 Abnormal levels of other serum enzymes: Secondary | ICD-10-CM | POA: Diagnosis not present

## 2021-03-30 DIAGNOSIS — R1012 Left upper quadrant pain: Secondary | ICD-10-CM | POA: Diagnosis present

## 2021-03-30 MED ORDER — IOHEXOL 300 MG/ML  SOLN
100.0000 mL | Freq: Once | INTRAMUSCULAR | Status: AC | PRN
  Administered 2021-03-30: 80 mL via INTRAVENOUS

## 2021-04-07 ENCOUNTER — Ambulatory Visit: Payer: 59 | Admitting: Gastroenterology

## 2021-04-22 ENCOUNTER — Encounter: Payer: Self-pay | Admitting: Family Medicine

## 2021-04-22 ENCOUNTER — Ambulatory Visit: Payer: 59 | Admitting: Family Medicine

## 2021-04-22 VITALS — BP 139/92 | HR 82 | Temp 98.9°F | Wt 211.8 lb

## 2021-04-22 DIAGNOSIS — I10 Essential (primary) hypertension: Secondary | ICD-10-CM | POA: Diagnosis not present

## 2021-04-22 DIAGNOSIS — I700 Atherosclerosis of aorta: Secondary | ICD-10-CM

## 2021-04-22 NOTE — Progress Notes (Signed)
Subjective:  ? ? Patient ID: Garrett Anderson, male    DOB: [birth year], 46 y.o.   MRN: [id number] ? ?Chief Complaint  ?Patient presents with  ? Follow-up  ?  BP  ? ? ?HPI ?Patient is a 46 year old male with pmh sig for HTN, h/o migraines who was seen today for f/u on HTN.  Pt states he has not taken his bp med yet as he just got off work and exercised this am.  BP at work controlled typically 1 teens to 130s/70s-80s.  Taking lisinopril-hydrochlorothiazide 10-12.5 mg daily.  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 ? ?Patient had follow-up with gastroenterology for history of LUQ abdominal pain and elevated lipase.  CT abdomen pelvis negative with the exception of aortic atherosclerosis.  LDL cholesterol mildly elevated at 130.  Not currently on medication.  Advised to continue exercising regularly and decrease portions.  Patient notes improvement in LUQ pain.  Thinks may be associated with eating acidic foods like Pasta sauce/pizza.  Not currently taking Protonix. ? ?No past medical history on file. ?Pt is a Jehovah's witness. ?No Known Allergies ? ?ROS ?General: Denies fever, chills, night sweats, changes in weight, changes in appetite ?HEENT: Denies headaches, ear pain, changes in vision, rhinorrhea, sore throat ?CV: Denies CP, palpitations, SOB, orthopnea ?Pulm: Denies SOB, cough, wheezing ?GI: Denies abdominal pain, nausea, vomiting, diarrhea, constipation ?GU: Denies dysuria, hematuria, frequency ?Msk: Denies muscle cramps, joint pains ?Neuro: Denies weakness, numbness, tingling ?Skin: Denies rashes, bruising ?Psych: Denies depression, anxiety, hallucinations ? ?Objective:  ?  ?Blood pressure (!) 139/92, pulse 82, temperature 98.9 ?F (37.2 ?C), temperature source Oral, weight 211 lb 12.8 oz (96.1 kg), SpO2 100 %. ? ?Gen. Pleasant, well-nourished, in no distress, normal affect   ?HEENT: [place]/AT, face symmetric, conjunctiva clear, no  scleral icterus, PERRLA, EOMI, nares patent without drainage ?Lungs: no accessory muscle use ?Cardiovascular: RRR, no peripheral edema ?Musculoskeletal: No deformities, no cyanosis or clubbing, normal tone ?Neuro:  A&Ox3, CN II-XII intact, normal gait ?Skin:  Warm, no lesions/ rash ? ?Wt Readings from Last 3 Encounters:  ?04/22/21 211 lb 12.8 oz (96.1 kg)  ?03/18/21 217 lb 9.6 oz (98.7 kg)  ?02/17/21 218 lb 9.6 oz (99.2 kg)  ? ? ?Lab Results  ?Component Value Date  ? WBC 5.3 03/18/2021  ? HGB 14.3 03/18/2021  ? HCT 43.3 03/18/2021  ? PLT 194.0 03/18/2021  ? GLUCOSE 83 03/18/2021  ? CHOL 194 10/22/2020  ? TRIG 42.0 10/22/2020  ? HDL 48.00 10/22/2020  ? LDLCALC 138 (H) 10/22/2020  ? ALT 20 03/18/2021  ? AST 19 03/18/2021  ? NA 136 03/18/2021  ? K 3.8 03/18/2021  ? CL 100 03/18/2021  ? CREATININE 1.02 03/18/2021  ? BUN 15 03/18/2021  ? CO2 31 03/18/2021  ? TSH 0.93 10/22/2020  ? PSA 0.50 10/22/2020  ? HGBA1C 5.9 10/22/2020  ? ? ?Assessment/Plan: ? ?Essential hypertension ?-Elevated, likely 2/2 not taking medication yet.  Typically controlled at work ?-Patient to take medication when he gets home ?-Continue lifestyle modifications ?-Continue lisinopril-hydrochlorothiazide 10-12.5 mg daily ? ?Aortic atherosclerosis (HCC) ?-LDL 130, while other cholesterol levels normal ?-continue lifestyle modifications including diet and exercise ?-Continue to monitor.  For continued or increased elevation consider statin or fish oil tablets. ? ?F/u 4-6 months, sooner if needed ? ?Abbe Amsterdam, MD ?

## 2021-07-15 ENCOUNTER — Other Ambulatory Visit: Payer: Self-pay

## 2021-07-15 ENCOUNTER — Other Ambulatory Visit: Payer: Self-pay | Admitting: Family Medicine

## 2021-07-15 DIAGNOSIS — I10 Essential (primary) hypertension: Secondary | ICD-10-CM

## 2021-07-15 MED ORDER — LISINOPRIL-HYDROCHLOROTHIAZIDE 10-12.5 MG PO TABS: ORAL_TABLET | ORAL | 0 refills | Status: DC

## 2022-07-23 ENCOUNTER — Other Ambulatory Visit: Payer: Self-pay | Admitting: Family Medicine

## 2022-07-23 DIAGNOSIS — I10 Essential (primary) hypertension: Secondary | ICD-10-CM

## 2022-10-25 ENCOUNTER — Other Ambulatory Visit: Payer: Self-pay | Admitting: Family Medicine

## 2022-10-25 DIAGNOSIS — I10 Essential (primary) hypertension: Secondary | ICD-10-CM

## 2022-10-29 ENCOUNTER — Other Ambulatory Visit: Payer: Self-pay | Admitting: Family Medicine

## 2022-10-29 DIAGNOSIS — I10 Essential (primary) hypertension: Secondary | ICD-10-CM

## 2022-12-07 DIAGNOSIS — K760 Fatty (change of) liver, not elsewhere classified: Secondary | ICD-10-CM | POA: Insufficient documentation

## 2022-12-07 DIAGNOSIS — E6609 Other obesity due to excess calories: Secondary | ICD-10-CM | POA: Insufficient documentation

## 2022-12-07 DIAGNOSIS — R1012 Left upper quadrant pain: Secondary | ICD-10-CM | POA: Insufficient documentation

## 2022-12-08 ENCOUNTER — Encounter: Payer: Self-pay | Admitting: Family Medicine

## 2022-12-08 ENCOUNTER — Ambulatory Visit (INDEPENDENT_AMBULATORY_CARE_PROVIDER_SITE_OTHER): Payer: 59 | Admitting: Family Medicine

## 2022-12-08 VITALS — BP 134/82 | HR 74 | Temp 98.7°F | Ht 68.7 in | Wt 220.2 lb

## 2022-12-08 DIAGNOSIS — Z125 Encounter for screening for malignant neoplasm of prostate: Secondary | ICD-10-CM | POA: Diagnosis not present

## 2022-12-08 DIAGNOSIS — Z0000 Encounter for general adult medical examination without abnormal findings: Secondary | ICD-10-CM | POA: Diagnosis not present

## 2022-12-08 DIAGNOSIS — I10 Essential (primary) hypertension: Secondary | ICD-10-CM | POA: Diagnosis not present

## 2022-12-08 DIAGNOSIS — I700 Atherosclerosis of aorta: Secondary | ICD-10-CM

## 2022-12-08 DIAGNOSIS — E7841 Elevated Lipoprotein(a): Secondary | ICD-10-CM | POA: Diagnosis not present

## 2022-12-08 DIAGNOSIS — R7303 Prediabetes: Secondary | ICD-10-CM | POA: Diagnosis not present

## 2022-12-08 LAB — CBC WITH DIFFERENTIAL/PLATELET
Basophils Absolute: 0 10*3/uL (ref 0.0–0.1)
Basophils Relative: 0.9 % (ref 0.0–3.0)
Eosinophils Absolute: 0.2 10*3/uL (ref 0.0–0.7)
Eosinophils Relative: 3.6 % (ref 0.0–5.0)
HCT: 42 % (ref 39.0–52.0)
Hemoglobin: 13.9 g/dL (ref 13.0–17.0)
Lymphocytes Relative: 35.8 % (ref 12.0–46.0)
Lymphs Abs: 1.5 10*3/uL (ref 0.7–4.0)
MCHC: 33 g/dL (ref 30.0–36.0)
MCV: 88.1 fL (ref 78.0–100.0)
Monocytes Absolute: 0.5 10*3/uL (ref 0.1–1.0)
Monocytes Relative: 11.2 % (ref 3.0–12.0)
Neutro Abs: 2.1 10*3/uL (ref 1.4–7.7)
Neutrophils Relative %: 48.5 % (ref 43.0–77.0)
Platelets: 211 10*3/uL (ref 150.0–400.0)
RBC: 4.76 Mil/uL (ref 4.22–5.81)
RDW: 13.5 % (ref 11.5–15.5)
WBC: 4.3 10*3/uL (ref 4.0–10.5)

## 2022-12-08 LAB — COMPREHENSIVE METABOLIC PANEL
ALT: 21 U/L (ref 0–53)
AST: 17 U/L (ref 0–37)
Albumin: 4.1 g/dL (ref 3.5–5.2)
Alkaline Phosphatase: 55 U/L (ref 39–117)
BUN: 15 mg/dL (ref 6–23)
CO2: 28 meq/L (ref 19–32)
Calcium: 9.1 mg/dL (ref 8.4–10.5)
Chloride: 103 meq/L (ref 96–112)
Creatinine, Ser: 1.04 mg/dL (ref 0.40–1.50)
GFR: 85.67 mL/min (ref >60.00)
Glucose, Bld: 95 mg/dL (ref 70–99)
Potassium: 4.3 meq/L (ref 3.5–5.1)
Sodium: 137 meq/L (ref 135–145)
Total Bilirubin: 0.6 mg/dL (ref 0.2–1.2)
Total Protein: 6.9 g/dL (ref 6.0–8.3)

## 2022-12-08 LAB — LIPID PANEL
Cholesterol: 181 mg/dL (ref 0–200)
HDL: 40.6 mg/dL (ref >39.00)
LDL Cholesterol: 133 mg/dL — ABNORMAL HIGH (ref 0–99)
NonHDL: 140.71
Total CHOL/HDL Ratio: 4
Triglycerides: 37 mg/dL (ref 0.0–149.0)
VLDL: 7.4 mg/dL (ref 0.0–40.0)

## 2022-12-08 LAB — T4, FREE: Free T4: 0.88 ng/dL (ref 0.60–1.60)

## 2022-12-08 LAB — HEMOGLOBIN A1C: Hgb A1c MFr Bld: 6 % (ref 4.6–6.5)

## 2022-12-08 LAB — TSH: TSH: 0.57 u[IU]/mL (ref 0.35–5.50)

## 2022-12-08 LAB — PSA: PSA: 0.64 ng/mL (ref 0.10–4.00)

## 2022-12-08 MED ORDER — LISINOPRIL-HYDROCHLOROTHIAZIDE 10-12.5 MG PO TABS: 0.5000 | ORAL_TABLET | Freq: Every day | ORAL | 3 refills | Status: AC

## 2022-12-08 NOTE — Progress Notes (Signed)
Established Patient Office Visit   Subjective  Patient ID: Garrett Anderson, male    DOB: 27-Feb-1975  Age: 47 y.o. MRN: [id number]  Chief Complaint  Patient presents with   Annual Exam    Patient is a 47 year old male seen for CPE.  Patient states he been doing well overall, staying busy.  Has some tiredness but due to schedule.  Sleep is good.  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  Notes improvement in back pain.  May occur intermittently.  Working out more, stretching more, and eating better.  Patient taking lisinopril-HCTZ 10-12.5 mg half tab daily for BP.  Not currently checking BP at home but denies any issues with medication.  Requesting refill on med.    Patient Active Problem List   Diagnosis Date Noted   Fatty liver 12/07/2022   Left upper quadrant pain 12/07/2022   Obesity due to excess calories 12/07/2022   Migraine-cluster headache syndrome 06/06/2017   Swimmer's ear of left side 01/13/2015   Bilateral groin pain 12/14/2012   Chest pain, unspecified 05/12/2012   Benign hypertension 06/30/2010   History reviewed. No pertinent past medical history. Past Surgical History:  Procedure Laterality Date   ACHILLES TENDON SURGERY  02/02/03   left   Social History   Tobacco Use   Smoking status: Never   Smokeless tobacco: Never  Substance Use Topics   Alcohol use: Yes   Drug use: No   Family History  Problem Relation Age of Onset   Cancer Mother        lung   Cancer Paternal Grandfather    No Known Allergies    ROS Negative unless stated above    Objective:     BP 134/82 (BP Location: Left Arm, Patient Position: Sitting, Cuff Size: Large)   Pulse 74   Temp 98.7 F (37.1 C) (Oral)   Ht 5' 8.7" (1.745 m)   Wt 220 lb 3.2 oz (99.9 kg)   SpO2 95%   BMI 32.80 kg/m  BP Readings from Last 3 Encounters:  12/08/22 134/82  04/22/21 (!) 139/92  03/18/21 (!) 148/78   Wt Readings from Last 3 Encounters:  12/08/22 220 lb 3.2 oz (99.9 kg)   04/22/21 211 lb 12.8 oz (96.1 kg)  03/18/21 217 lb 9.6 oz (98.7 kg)      Physical Exam Constitutional:      Appearance: Normal appearance.  HENT:     Head: Normocephalic and atraumatic.     Right Ear: Tympanic membrane, ear canal and external ear normal.     Left Ear: Tympanic membrane, ear canal and external ear normal.     Nose: Nose normal.     Mouth/Throat:     Mouth: Mucous membranes are moist.     Pharynx: No oropharyngeal exudate or posterior oropharyngeal erythema.  Eyes:     General: No scleral icterus.    Extraocular Movements: Extraocular movements intact.     Conjunctiva/sclera: Conjunctivae normal.     Pupils: Pupils are equal, round, and reactive to light.  Neck:     Thyroid: No thyromegaly.  Cardiovascular:     Rate and Rhythm: Normal rate and regular rhythm.     Pulses: Normal pulses.     Heart sounds: Normal heart sounds. No murmur heard.    No friction rub.  Pulmonary:     Effort: Pulmonary effort is normal.     Breath sounds: Normal breath sounds. No wheezing, rhonchi or rales.  Abdominal:  General: Bowel sounds are normal.     Palpations: Abdomen is soft.     Tenderness: There is no abdominal tenderness.  Musculoskeletal:        General: No deformity. Normal range of motion.  Lymphadenopathy:     Cervical: No cervical adenopathy.  Skin:    General: Skin is warm and dry.     Findings: No lesion.  Neurological:     General: No focal deficit present.     Mental Status: He is alert and oriented to person, place, and time.  Psychiatric:        Mood and Affect: Mood normal.        Thought Content: Thought content normal.      No results found for any visits on 12/08/22.    Assessment & Plan:  Encounter for preventive health examination -     CBC with Differential/Platelet; Future  Essential hypertension -     CBC with Differential/Platelet; Future -     Comprehensive metabolic panel; Future -     TSH; Future -      Lisinopril-hydroCHLOROthiazide; Take 0.5 tablets by mouth daily.  Dispense: 45 tablet; Refill: 3 -     T4, free; Future  Screening for prostate cancer -     PSA; Future  Prediabetes -Hemoglobin A1c 5.9% on 10/22/2020 -Continue lifestyle modifications -     Hemoglobin A1c; Future  Elevated lipoprotein(a) -Total cholesterol 194, HDL 49, LDL 131, triglycerides 51 8/65/7846 -Continue lifestyle modifications -     Lipid panel; Future  Aortic atherosclerosis (HCC) -Noted on CT pancreas abdomen with without contrast on 03/30/2021 -Lifestyle modifications -     Lipid panel; Future  Age-appropriate health screenings discussed.  Labs ordered.  Immunizations reviewed.  Patient declines influenza vaccine this visit.  Colonoscopy done 01/29/2021.  Repeat 2032.  Continue lisinopril-HCTZ 10-12.5 mg half tab daily.  Continue lifestyle modifications.  Return if symptoms worsen or fail to improve.   Deeann Saint, MD

## 2023-07-28 ENCOUNTER — Encounter: Payer: Self-pay | Admitting: Family Medicine

## 2023-07-28 ENCOUNTER — Ambulatory Visit: Admitting: Family Medicine

## 2023-07-28 ENCOUNTER — Other Ambulatory Visit (HOSPITAL_BASED_OUTPATIENT_CLINIC_OR_DEPARTMENT_OTHER): Payer: Self-pay

## 2023-07-28 VITALS — BP 122/84 | HR 63 | Temp 97.9°F | Ht 68.7 in | Wt 222.2 lb

## 2023-07-28 DIAGNOSIS — R7303 Prediabetes: Secondary | ICD-10-CM

## 2023-07-28 DIAGNOSIS — Z7689 Persons encountering health services in other specified circumstances: Secondary | ICD-10-CM

## 2023-07-28 DIAGNOSIS — E66811 Obesity, class 1: Secondary | ICD-10-CM

## 2023-07-28 DIAGNOSIS — Z6833 Body mass index (BMI) 33.0-33.9, adult: Secondary | ICD-10-CM | POA: Diagnosis not present

## 2023-07-28 MED ORDER — SEMAGLUTIDE-WEIGHT MANAGEMENT 0.5 MG/0.5ML ~~LOC~~ SOAJ
0.5000 mg | SUBCUTANEOUS | 2 refills | Status: AC
  Filled 2023-07-28: qty 2, fill #0
  Filled 2023-08-19: qty 2, 28d supply, fill #0

## 2023-07-28 MED ORDER — SEMAGLUTIDE-WEIGHT MANAGEMENT 0.25 MG/0.5ML ~~LOC~~ SOAJ
0.2500 mg | SUBCUTANEOUS | 0 refills | Status: AC
  Filled 2023-07-28: qty 2, 28d supply, fill #0

## 2023-07-28 NOTE — Progress Notes (Signed)
 Established Patient Office Visit   Subjective  Patient ID: Garrett Anderson, male    DOB: 03-03-1975  Age: 48 y.o. MRN: [id number]  Chief Complaint  Patient presents with   Medical Management of Chronic Issues    Weight loss questions     Patient is a 48 year old male seen for ongoing concern.  Patient endorses inability to lose weight.  Currently staying around 220 lbs despite all efforts.  Would like to get down to 190 or 200 lbs.  Tried intermittent fasting and increasing protein.  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    Patient Active Problem List   Diagnosis Date Noted   Fatty liver 12/07/2022   Left upper quadrant pain 12/07/2022   Obesity due to excess calories 12/07/2022   Migraine-cluster headache syndrome 06/06/2017   Swimmer's ear of left side 01/13/2015   Bilateral groin pain 12/14/2012   Chest pain, unspecified 05/12/2012   Benign hypertension 06/30/2010   History reviewed. No pertinent past medical history. Past Surgical History:  Procedure Laterality Date   ACHILLES TENDON SURGERY  02/02/03   left   Social History   Tobacco Use   Smoking status: Never   Smokeless tobacco: Never  Substance Use Topics   Alcohol use: Yes   Drug use: No   Family History  Problem Relation Age of Onset   Cancer Mother        lung   Cancer Paternal Grandfather    No Known Allergies  ROS Negative unless stated above    Objective:     BP 122/84 (BP Location: Left Arm, Patient Position: Sitting, Cuff Size: Normal)   Pulse 63   Temp 97.9 F (36.6 C) (Oral)   Ht 5' 8.7 (1.745 m)   Wt 222 lb 3.2 oz (100.8 kg)   SpO2 94%   BMI 33.10 kg/m  BP Readings from Last 3 Encounters:  07/28/23 122/84  12/08/22 134/82  04/22/21 (!) 139/92   Wt Readings from Last 3 Encounters:  07/28/23 222 lb 3.2 oz (100.8 kg)  12/08/22 220 lb 3.2 oz (99.9 kg)  04/22/21 211 lb 12.8 oz (96.1 kg)      Physical Exam Constitutional:      General: He is not in acute  distress.    Appearance: Normal appearance.  HENT:     Head: Normocephalic and atraumatic.     Nose: Nose normal.     Mouth/Throat:     Mouth: Mucous membranes are moist.   Cardiovascular:     Rate and Rhythm: Normal rate and regular rhythm.     Heart sounds: Normal heart sounds. No murmur heard.    No gallop.  Pulmonary:     Effort: Pulmonary effort is normal. No respiratory distress.     Breath sounds: Normal breath sounds. No wheezing, rhonchi or rales.   Skin:    General: Skin is warm and dry.   Neurological:     Mental Status: He is alert and oriented to person, place, and time.       12/08/2022    9:04 AM 04/22/2021    8:25 AM 03/18/2021    2:20 PM  Depression screen PHQ 2/9  Decreased Interest 0 0 0  Down, Depressed, Hopeless 0 0 0  PHQ - 2 Score 0 0 0  Altered sleeping 1 1   Tired, decreased energy 1 0   Change in appetite 1 0   Feeling bad or failure about yourself  0 0  Trouble concentrating 0 0   Moving slowly or fidgety/restless 0 0   Suicidal thoughts 0 0   PHQ-9 Score 3 1   Difficult doing work/chores Not difficult at all Not difficult at all       12/08/2022    9:04 AM 10/22/2020    8:13 AM  GAD 7 : Generalized Anxiety Score  Nervous, Anxious, on Edge 0 0  Control/stop worrying 0 0  Worry too much - different things 0 0  Trouble relaxing 0 0  Restless 0 0  Easily annoyed or irritable 0 0  Afraid - awful might happen 0 0  Total GAD 7 Score 0 0  Anxiety Difficulty Not difficult at all      No results found for any visits on 07/28/23.    Assessment & Plan:   Class 1 obesity without serious comorbidity with body mass index (BMI) of 33.0 to 33.9 in adult, unspecified obesity type -     Semaglutide-Weight Management; Inject 0.25 mg into the skin once a week for 28 days.  Dispense: 2 mL; Refill: 0 -     Semaglutide-Weight Management; Inject 0.5 mg into the skin once a week.  Dispense: 2 mL; Refill: 2  Encounter for weight  management  Prediabetes -     Semaglutide-Weight Management; Inject 0.25 mg into the skin once a week for 28 days.  Dispense: 2 mL; Refill: 0 -     Semaglutide-Weight Management; Inject 0.5 mg into the skin once a week.  Dispense: 2 mL; Refill: 2   Patient having difficulty losing weight despite various consistent efforts and very active lifestyle.  Current BMI 33.1 kg/m and weight to 222.2 lbs. discussed r/b/a of various weight loss medication options. Will start Wegovy 0.25 mg weekly x 1 month then increase to 0.5 mg weekly.  Continue lifestyle modifications and physical activity.  Return in about 6 weeks (around 09/08/2023).   Clotilda JONELLE Single, MD

## 2023-07-29 ENCOUNTER — Telehealth: Payer: Self-pay

## 2023-07-29 ENCOUNTER — Other Ambulatory Visit (HOSPITAL_COMMUNITY): Payer: Self-pay

## 2023-07-29 ENCOUNTER — Other Ambulatory Visit (HOSPITAL_BASED_OUTPATIENT_CLINIC_OR_DEPARTMENT_OTHER): Payer: Self-pay

## 2023-07-29 NOTE — Telephone Encounter (Signed)
 Pharmacy Patient Advocate Encounter  Received notification from [employer] that Prior Authorization for Wegovy  0.25 has been APPROVED from 07/29/23 to 11/28/23. Unable to obtain price due to refill too soon rejection, last fill date 07/29/23 next available fill date7/18/25   PA #/Case ID/Reference #: [id number]

## 2023-07-29 NOTE — Telephone Encounter (Signed)
 Pharmacy Patient Advocate Encounter   Received notification from Cover[employer]Meds that prior authorization for Wegovy  0.25 is required/requested.   Insurance verification completed.   The patient is insured through [facility] .   Per test claim: PA required; PA submitted to above mentioned insurance via CoverMyMeds Key/confirmation #/EOC [id number] Status is pending

## 2023-08-19 ENCOUNTER — Other Ambulatory Visit (HOSPITAL_BASED_OUTPATIENT_CLINIC_OR_DEPARTMENT_OTHER): Payer: Self-pay

## 2023-11-10 IMAGING — CT CT ABDOMEN WO/W CM
2 of 12 series · 10 of 46 positions shown, 16 images · IV contrast (agent unspecified)
Comparison: None.

CLINICAL DATA: Left upper quadrant pain intermittently for 1 year,
elevated lipase

EXAM:
CT ABDOMEN WITHOUT AND WITH CONTRAST
TECHNIQUE: Multidetector CT imaging of the abdomen was performed following the
standard protocol before and following the bolus administration of
intravenous contrast.

[Series 9: cor arterial · coronal · arterial · 0.50mm/px · 3 of 159 slices shown, 4 images]
[im 40/159  soft-tissue]
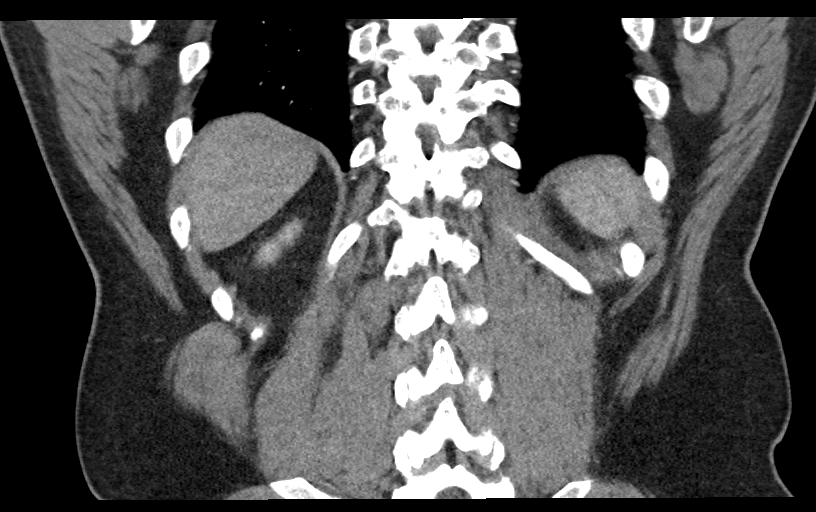
[im 80/159  soft-tissue]
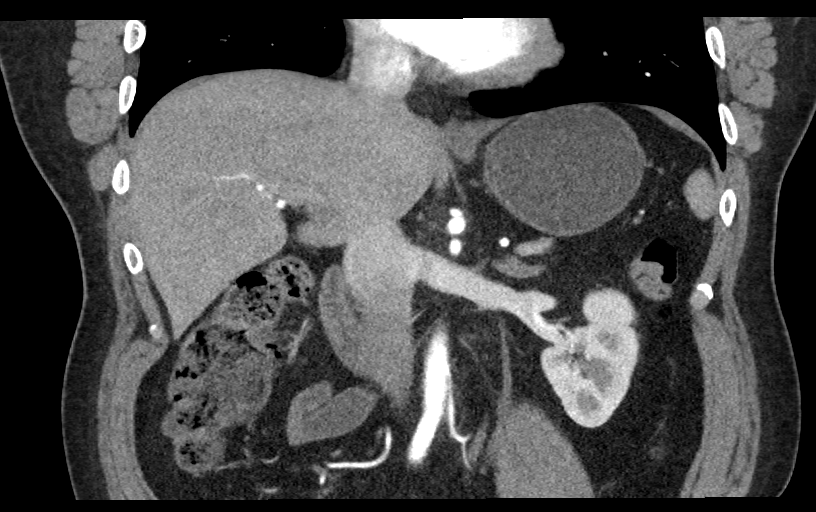
[im 80/159  bone]
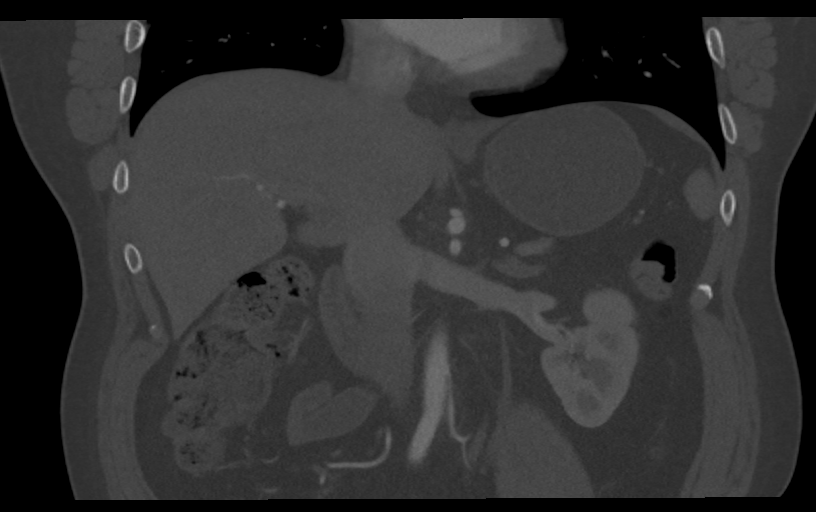
[im 119/159  soft-tissue]
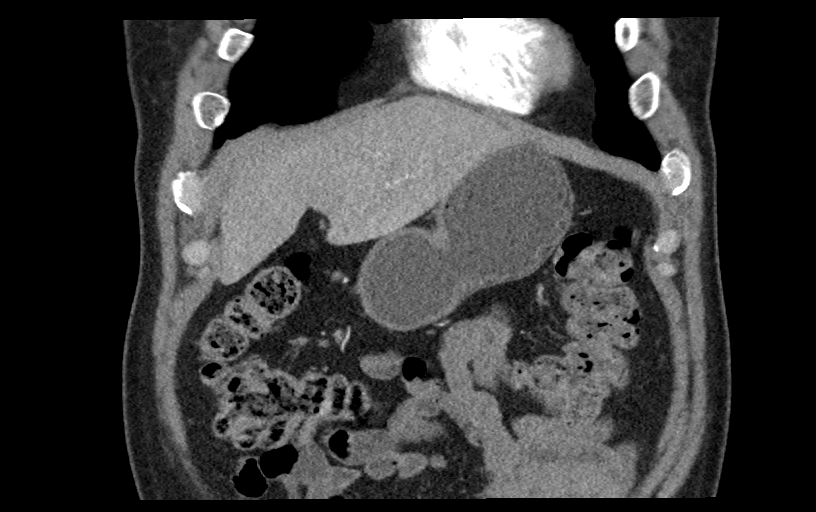

[Series 13: portal pv 2.0 · axial · portal-venous · 0.76mm/px · z∈[-109,+71]mm · 7 of 121 slices shown, 12 images]
[im 16/121  soft-tissue]
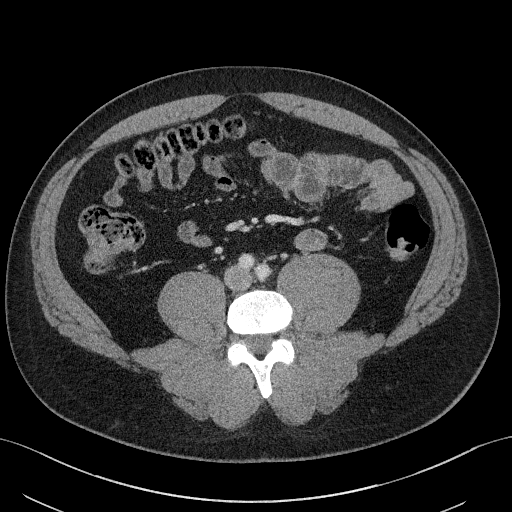
[im 16/121  bone]
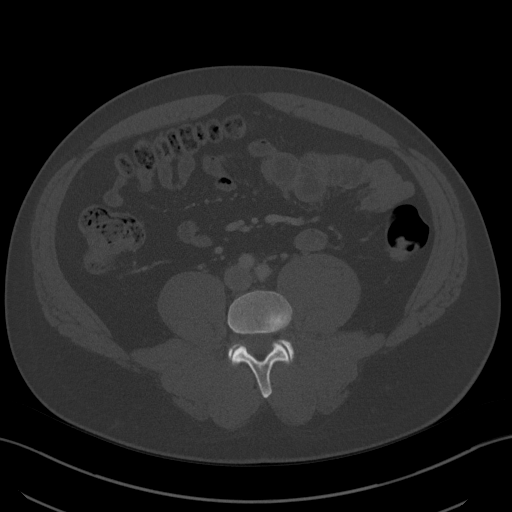
[im 31/121  soft-tissue]
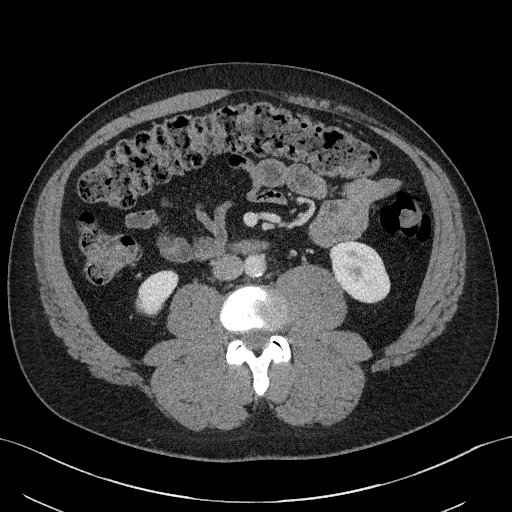
[im 46/121  soft-tissue]
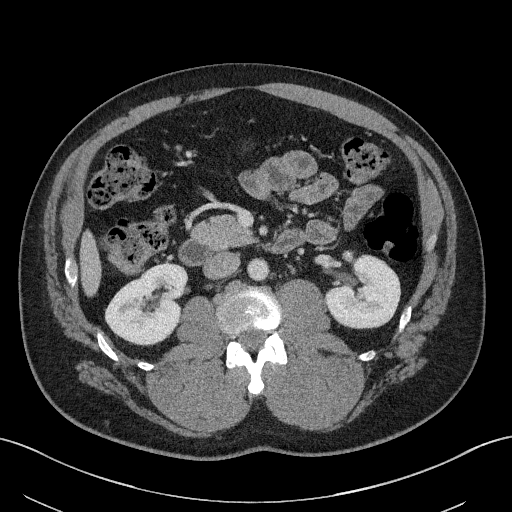
[im 61/121  soft-tissue]
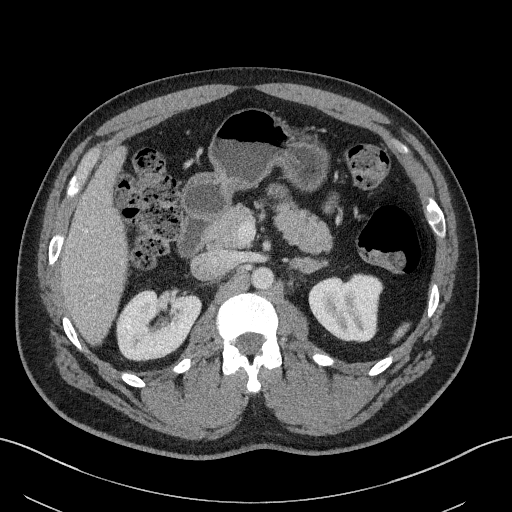
[im 61/121  lung]
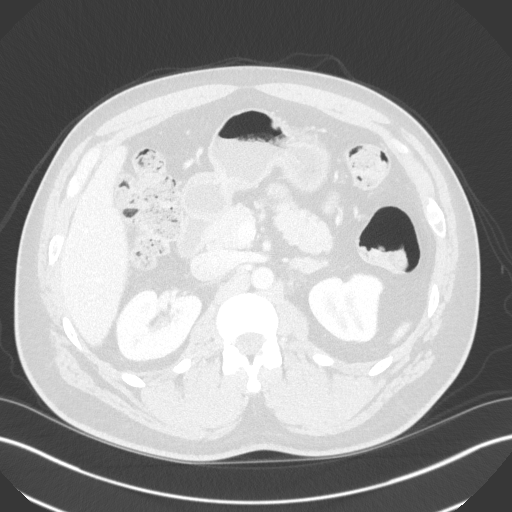
[im 76/121  soft-tissue]
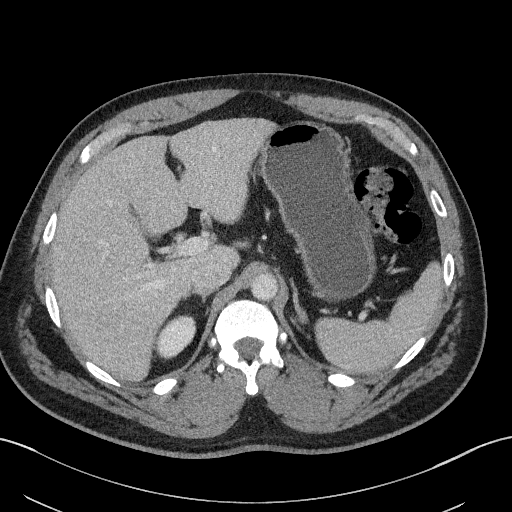
[im 76/121  lung]
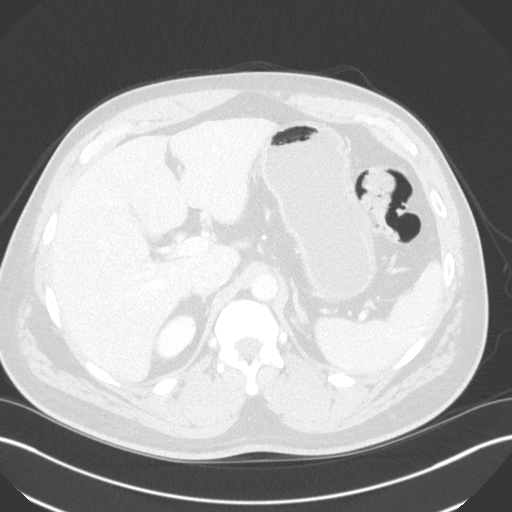
[im 91/121  soft-tissue]
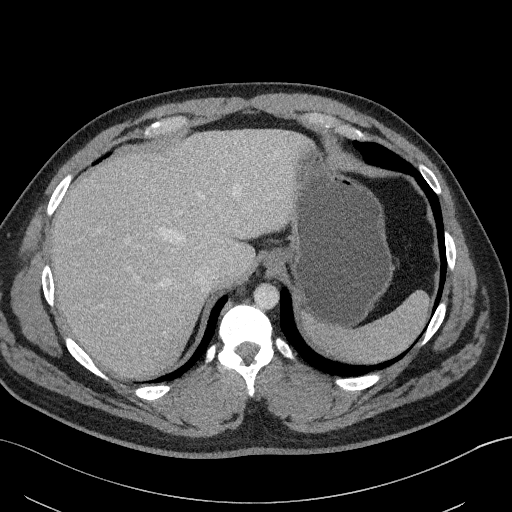
[im 91/121  lung]
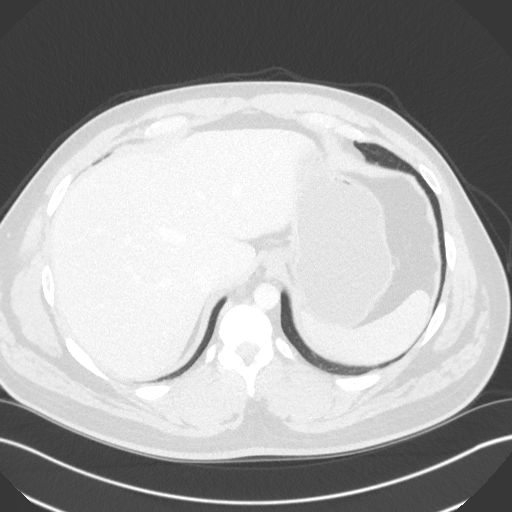
[im 106/121  soft-tissue]
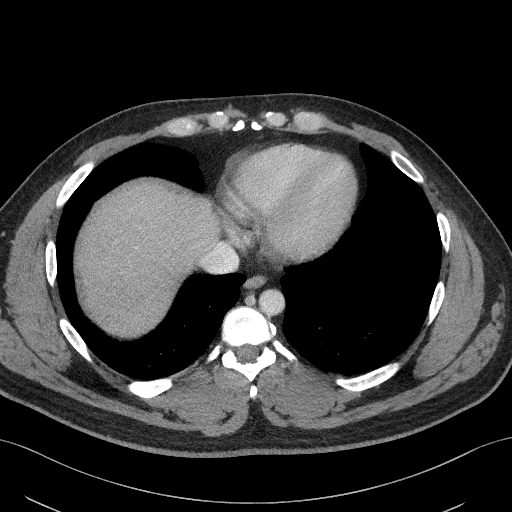
[im 106/121  lung]
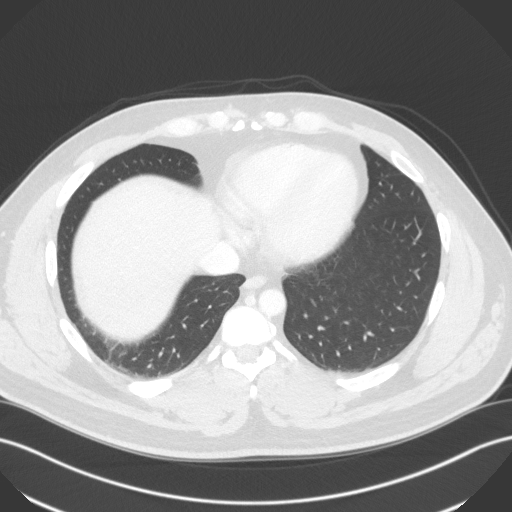

[10 of 46 positions shown; findings below may reference images not displayed]

RADIATION DOSE REDUCTION: This exam was performed according to the
departmental dose-optimization program which includes automated
exposure control, adjustment of the mA and/or kV according to
patient size and/or use of iterative reconstruction technique.

CONTRAST:  80mL OMNIPAQUE IOHEXOL 300 MG/ML  SOLN
FINDINGS: Lower chest: No acute abnormality.

Hepatobiliary: No solid liver abnormality is seen. No gallstones,
gallbladder wall thickening, or biliary dilatation.

Pancreas: Unremarkable. No pancreatic ductal dilatation or
surrounding inflammatory changes.

Spleen: Normal in size without significant abnormality.

Adrenals/Urinary Tract: Benign, macroscopic fat containing lateral
limb left adrenal adenoma (series 4, image 26). Kidneys are normal,
without renal calculi, solid lesion, or hydronephrosis. Bladder is
unremarkable.

Stomach/Bowel: Stomach is within normal limits. Appendix appears
normal. No evidence of bowel wall thickening, distention, or
inflammatory changes. Moderate burden of stool in the included
colon.

Vascular/Lymphatic: Aortic atherosclerosis. No enlarged abdominal
lymph nodes.

Other: No abdominal wall hernia or abnormality. No ascites.

Musculoskeletal: No acute or significant osseous findings.
IMPRESSION: 1. No CT findings of the abdomen to explain left upper quadrant
abdominal pain.

2.  No findings to explain elevated lipase.

Aortic Atherosclerosis ([id number]-XMP.P).

## 2023-11-18 ENCOUNTER — Other Ambulatory Visit (HOSPITAL_COMMUNITY): Payer: Self-pay

## 2023-11-18 ENCOUNTER — Telehealth: Payer: Self-pay

## 2023-11-18 NOTE — Telephone Encounter (Signed)
 Prior Authorization form/request asks a question that requires your assistance. Please see the question below and advise accordingly. The PA will not be submitted until the necessary information is received.  Patient needs new weight and BMI. Has not had an office visit since 07/28/23. Neew weight and BMI check within 45 days of PA request.

## 2023-11-21 NOTE — Telephone Encounter (Signed)
 Called patient and left a VM to return call patient needs to bee seen in the office

## 2023-11-25 ENCOUNTER — Other Ambulatory Visit (HOSPITAL_COMMUNITY): Payer: Self-pay

## 2023-11-28 ENCOUNTER — Other Ambulatory Visit (HOSPITAL_COMMUNITY): Payer: Self-pay

## 2024-02-29 ENCOUNTER — Other Ambulatory Visit (HOSPITAL_BASED_OUTPATIENT_CLINIC_OR_DEPARTMENT_OTHER): Payer: Self-pay | Admitting: Family Medicine

## 2024-02-29 DIAGNOSIS — Z8249 Family history of ischemic heart disease and other diseases of the circulatory system: Secondary | ICD-10-CM

## 2024-03-28 ENCOUNTER — Other Ambulatory Visit (HOSPITAL_BASED_OUTPATIENT_CLINIC_OR_DEPARTMENT_OTHER)
